# Patient Record
Sex: Male | Born: 1935 | Race: White | Hispanic: No | Marital: Married | Smoking: Never smoker
Health system: Southern US, Community
[De-identification: ages and names within clinical notes are randomized; demographics above are authoritative.]

## PROBLEM LIST (undated history)

## (undated) DIAGNOSIS — I10 Essential (primary) hypertension: Principal | ICD-10-CM

## (undated) DIAGNOSIS — I2510 Atherosclerotic heart disease of native coronary artery without angina pectoris: Secondary | ICD-10-CM

## (undated) DIAGNOSIS — F0280 Dementia in other diseases classified elsewhere without behavioral disturbance: Secondary | ICD-10-CM

## (undated) DIAGNOSIS — I482 Chronic atrial fibrillation, unspecified: Secondary | ICD-10-CM

## (undated) DIAGNOSIS — I219 Acute myocardial infarction, unspecified: Secondary | ICD-10-CM

## (undated) DIAGNOSIS — M791 Myalgia, unspecified site: Secondary | ICD-10-CM

## (undated) DIAGNOSIS — E785 Hyperlipidemia, unspecified: Secondary | ICD-10-CM

## (undated) DIAGNOSIS — I4891 Unspecified atrial fibrillation: Secondary | ICD-10-CM

## (undated) DIAGNOSIS — I480 Paroxysmal atrial fibrillation: Secondary | ICD-10-CM

## (undated) DIAGNOSIS — M549 Dorsalgia, unspecified: Secondary | ICD-10-CM

## (undated) DIAGNOSIS — E119 Type 2 diabetes mellitus without complications: Secondary | ICD-10-CM

## (undated) HISTORY — DX: Unspecified atrial fibrillation: I48.91

## (undated) HISTORY — DX: Myalgia, unspecified site: M79.10

## (undated) HISTORY — DX: Acute myocardial infarction, unspecified: I21.9

## (undated) HISTORY — DX: Essential (primary) hypertension: I10

## (undated) HISTORY — DX: Dorsalgia, unspecified: M54.9

## (undated) HISTORY — DX: Paroxysmal atrial fibrillation: I48.0

## (undated) HISTORY — DX: Atherosclerotic heart disease of native coronary artery without angina pectoris: I25.10

---

## 2005-03-24 ENCOUNTER — Ambulatory Visit: Payer: Self-pay | Admitting: Family Medicine

## 2005-07-13 ENCOUNTER — Ambulatory Visit: Payer: Self-pay | Admitting: Family Medicine

## 2005-08-01 ENCOUNTER — Ambulatory Visit: Payer: Self-pay | Admitting: Family Medicine

## 2005-09-26 ENCOUNTER — Ambulatory Visit: Payer: Self-pay | Admitting: Family Medicine

## 2005-11-21 ENCOUNTER — Ambulatory Visit: Payer: Self-pay | Admitting: Family Medicine

## 2006-01-09 ENCOUNTER — Ambulatory Visit: Payer: Self-pay | Admitting: Family Medicine

## 2006-04-18 ENCOUNTER — Ambulatory Visit: Payer: Self-pay | Admitting: Family Medicine

## 2006-04-29 ENCOUNTER — Inpatient Hospital Stay (HOSPITAL_COMMUNITY): Admission: EM | Admit: 2006-04-29 | Discharge: 2006-05-02 | Payer: Self-pay | Admitting: Emergency Medicine

## 2006-05-03 HISTORY — PX: PERCUTANEOUS CORONARY ROTOBLATOR INTERVENTION (PCI-R): SHX6015

## 2006-05-03 HISTORY — PX: CARDIAC CATHETERIZATION: SHX172

## 2006-06-12 ENCOUNTER — Ambulatory Visit (HOSPITAL_COMMUNITY): Admission: RE | Admit: 2006-06-12 | Discharge: 2006-06-13 | Payer: Self-pay | Admitting: Cardiology

## 2009-05-10 ENCOUNTER — Emergency Department (HOSPITAL_COMMUNITY): Admission: EM | Admit: 2009-05-10 | Discharge: 2009-05-10 | Payer: Self-pay | Admitting: Emergency Medicine

## 2009-06-06 ENCOUNTER — Inpatient Hospital Stay (HOSPITAL_COMMUNITY): Admission: EM | Admit: 2009-06-06 | Discharge: 2009-06-11 | Payer: Self-pay | Admitting: Emergency Medicine

## 2009-06-06 ENCOUNTER — Ambulatory Visit: Payer: Self-pay | Admitting: Family Medicine

## 2009-06-08 ENCOUNTER — Ambulatory Visit: Payer: Self-pay | Admitting: Internal Medicine

## 2009-06-10 ENCOUNTER — Encounter (INDEPENDENT_AMBULATORY_CARE_PROVIDER_SITE_OTHER): Payer: Self-pay | Admitting: General Surgery

## 2010-01-16 ENCOUNTER — Observation Stay (HOSPITAL_COMMUNITY): Admission: EM | Admit: 2010-01-16 | Discharge: 2010-01-17 | Payer: Self-pay | Admitting: Emergency Medicine

## 2011-01-29 LAB — TROPONIN I
Troponin I: 0.02 ng/mL (ref 0.00–0.06)
Troponin I: 0.03 ng/mL (ref 0.00–0.06)

## 2011-01-29 LAB — COMPREHENSIVE METABOLIC PANEL
BUN: 16 mg/dL (ref 6–23)
CO2: 27 mEq/L (ref 19–32)
Chloride: 105 mEq/L (ref 96–112)
Creatinine, Ser: 1.22 mg/dL (ref 0.4–1.5)
GFR calc Af Amer: >60 mL/min (ref >60)
Glucose, Bld: 236 mg/dL — ABNORMAL HIGH (ref 70–99)
Sodium: 138 mEq/L (ref 135–145)

## 2011-01-29 LAB — CBC
MCHC: 34 g/dL (ref 30.0–36.0)
MCV: 92.2 fL (ref 78.0–100.0)
Platelets: 143 10*3/uL — ABNORMAL LOW (ref 150–400)
RBC: 4.04 MIL/uL — ABNORMAL LOW (ref 4.22–5.81)
RDW: 13.4 % (ref 11.5–15.5)
WBC: 11.4 10*3/uL — ABNORMAL HIGH (ref 4.0–10.5)

## 2011-01-29 LAB — BRAIN NATRIURETIC PEPTIDE: Pro B Natriuretic peptide (BNP): 186 pg/mL — ABNORMAL HIGH (ref 0.0–100.0)

## 2011-01-29 LAB — LIPID PANEL
Cholesterol: 108 mg/dL (ref 0–200)
HDL: 53 mg/dL (ref >39)
LDL Cholesterol: 41 mg/dL (ref 0–99)
Total CHOL/HDL Ratio: 2 RATIO
Triglycerides: 70 mg/dL (ref <150)

## 2011-01-29 LAB — DIFFERENTIAL
Basophils Absolute: 0 10*3/uL (ref 0.0–0.1)
Basophils Relative: 0 % (ref 0–1)
Eosinophils Absolute: 0 10*3/uL (ref 0.0–0.7)
Lymphs Abs: 0.7 10*3/uL (ref 0.7–4.0)
Monocytes Absolute: 0.6 10*3/uL (ref 0.1–1.0)
Monocytes Relative: 6 % (ref 3–12)
Neutrophils Relative %: 88 % — ABNORMAL HIGH (ref 43–77)

## 2011-01-29 LAB — GLUCOSE, CAPILLARY: Glucose-Capillary: 110 mg/dL — ABNORMAL HIGH (ref 70–99)

## 2011-01-29 LAB — CK TOTAL AND CKMB (NOT AT ARMC)
CK, MB: 1.9 ng/mL (ref 0.3–4.0)
Relative Index: INVALID (ref 0.0–2.5)
Relative Index: INVALID (ref 0.0–2.5)

## 2011-01-29 LAB — HEPARIN LEVEL (UNFRACTIONATED): Heparin Unfractionated: 0.15 IU/mL — ABNORMAL LOW (ref 0.30–0.70)

## 2011-01-29 LAB — APTT: aPTT: 81 seconds — ABNORMAL HIGH (ref 24–37)

## 2011-02-11 LAB — GLUCOSE, CAPILLARY
Glucose-Capillary: 129 mg/dL — ABNORMAL HIGH (ref 70–99)
Glucose-Capillary: 131 mg/dL — ABNORMAL HIGH (ref 70–99)
Glucose-Capillary: 131 mg/dL — ABNORMAL HIGH (ref 70–99)
Glucose-Capillary: 137 mg/dL — ABNORMAL HIGH (ref 70–99)
Glucose-Capillary: 139 mg/dL — ABNORMAL HIGH (ref 70–99)
Glucose-Capillary: 141 mg/dL — ABNORMAL HIGH (ref 70–99)
Glucose-Capillary: 147 mg/dL — ABNORMAL HIGH (ref 70–99)
Glucose-Capillary: 152 mg/dL — ABNORMAL HIGH (ref 70–99)
Glucose-Capillary: 155 mg/dL — ABNORMAL HIGH (ref 70–99)
Glucose-Capillary: 188 mg/dL — ABNORMAL HIGH (ref 70–99)

## 2011-02-11 LAB — COMPREHENSIVE METABOLIC PANEL
ALT: 26 U/L (ref 0–53)
AST: 25 U/L (ref 0–37)
AST: 34 U/L (ref 0–37)
Albumin: 2.5 g/dL — ABNORMAL LOW (ref 3.5–5.2)
Albumin: 2.7 g/dL — ABNORMAL LOW (ref 3.5–5.2)
Alkaline Phosphatase: 55 U/L (ref 39–117)
Alkaline Phosphatase: 56 U/L (ref 39–117)
Alkaline Phosphatase: 57 U/L (ref 39–117)
BUN: 16 mg/dL (ref 6–23)
BUN: 19 mg/dL (ref 6–23)
BUN: 7 mg/dL (ref 6–23)
CO2: 30 mEq/L (ref 19–32)
Calcium: 8.2 mg/dL — ABNORMAL LOW (ref 8.4–10.5)
Calcium: 8.3 mg/dL — ABNORMAL LOW (ref 8.4–10.5)
Calcium: 8.6 mg/dL (ref 8.4–10.5)
Creatinine, Ser: 0.96 mg/dL (ref 0.4–1.5)
GFR calc Af Amer: >60 mL/min (ref >60)
GFR calc Af Amer: >60 mL/min (ref >60)
GFR calc non Af Amer: >60 mL/min (ref >60)
Glucose, Bld: 159 mg/dL — ABNORMAL HIGH (ref 70–99)
Glucose, Bld: 206 mg/dL — ABNORMAL HIGH (ref 70–99)
Glucose, Bld: 209 mg/dL — ABNORMAL HIGH (ref 70–99)
Potassium: 2.9 mEq/L — ABNORMAL LOW (ref 3.5–5.1)
Potassium: 3.2 mEq/L — ABNORMAL LOW (ref 3.5–5.1)
Potassium: 3.3 mEq/L — ABNORMAL LOW (ref 3.5–5.1)
Sodium: 134 mEq/L — ABNORMAL LOW (ref 135–145)
Total Bilirubin: 3 mg/dL — ABNORMAL HIGH (ref 0.3–1.2)
Total Protein: 5.5 g/dL — ABNORMAL LOW (ref 6.0–8.3)
Total Protein: 5.7 g/dL — ABNORMAL LOW (ref 6.0–8.3)
Total Protein: 6 g/dL (ref 6.0–8.3)
Total Protein: 6.8 g/dL (ref 6.0–8.3)

## 2011-02-11 LAB — CBC
HCT: 33.9 % — ABNORMAL LOW (ref 39.0–52.0)
HCT: 34.2 % — ABNORMAL LOW (ref 39.0–52.0)
HCT: 37.4 % — ABNORMAL LOW (ref 39.0–52.0)
HCT: 42.8 % (ref 39.0–52.0)
Hemoglobin: 12.5 g/dL — ABNORMAL LOW (ref 13.0–17.0)
Hemoglobin: 13 g/dL (ref 13.0–17.0)
Hemoglobin: 15 g/dL (ref 13.0–17.0)
MCHC: 34.7 g/dL (ref 30.0–36.0)
MCHC: 34.9 g/dL (ref 30.0–36.0)
MCV: 89.1 fL (ref 78.0–100.0)
MCV: 90.4 fL (ref 78.0–100.0)
Platelets: 105 10*3/uL — ABNORMAL LOW (ref 150–400)
Platelets: 123 10*3/uL — ABNORMAL LOW (ref 150–400)
RDW: 13 % (ref 11.5–15.5)
RDW: 13.2 % (ref 11.5–15.5)
RDW: 13.3 % (ref 11.5–15.5)
RDW: 13.3 % (ref 11.5–15.5)
RDW: 13.3 % (ref 11.5–15.5)
WBC: 6.6 10*3/uL (ref 4.0–10.5)
WBC: 9.2 10*3/uL (ref 4.0–10.5)

## 2011-02-11 LAB — HEMOGLOBIN A1C
Hgb A1c MFr Bld: 7.3 % — ABNORMAL HIGH (ref 4.6–6.1)
Mean Plasma Glucose: 160 mg/dL
Mean Plasma Glucose: 163 mg/dL

## 2011-02-11 LAB — D-DIMER, QUANTITATIVE: D-Dimer, Quant: 0.35 ug/mL-FEU (ref 0.00–0.48)

## 2011-02-11 LAB — POCT CARDIAC MARKERS
CKMB, poc: 2.4 ng/mL (ref 1.0–8.0)
Myoglobin, poc: 149 ng/mL (ref 12–200)
Troponin i, poc: <0.05 ng/mL (ref 0.00–0.09)

## 2011-02-11 LAB — BASIC METABOLIC PANEL
BUN: 5 mg/dL — ABNORMAL LOW (ref 6–23)
Creatinine, Ser: 1.02 mg/dL (ref 0.4–1.5)
GFR calc non Af Amer: >60 mL/min (ref >60)
Glucose, Bld: 146 mg/dL — ABNORMAL HIGH (ref 70–99)
Potassium: 3.4 mEq/L — ABNORMAL LOW (ref 3.5–5.1)

## 2011-02-11 LAB — DIFFERENTIAL
Basophils Absolute: 0 10*3/uL (ref 0.0–0.1)
Basophils Relative: 0 % (ref 0–1)
Monocytes Relative: 3 % (ref 3–12)
Neutro Abs: 10.4 10*3/uL — ABNORMAL HIGH (ref 1.7–7.7)
Neutrophils Relative %: 91 % — ABNORMAL HIGH (ref 43–77)

## 2011-02-11 LAB — HEPATIC FUNCTION PANEL
Albumin: 3.9 g/dL (ref 3.5–5.2)
Total Protein: 7 g/dL (ref 6.0–8.3)

## 2011-02-11 LAB — LIPASE, BLOOD: Lipase: 17 U/L (ref 11–59)

## 2011-02-12 LAB — POCT CARDIAC MARKERS
CKMB, poc: 1.5 ng/mL (ref 1.0–8.0)
CKMB, poc: 2 ng/mL (ref 1.0–8.0)
Troponin i, poc: <0.05 ng/mL (ref 0.00–0.09)
Troponin i, poc: <0.05 ng/mL (ref 0.00–0.09)

## 2011-02-12 LAB — POCT I-STAT, CHEM 8
BUN: 14 mg/dL (ref 6–23)
Calcium, Ion: 1.14 mmol/L (ref 1.12–1.32)
Chloride: 96 mEq/L (ref 96–112)
Glucose, Bld: 204 mg/dL — ABNORMAL HIGH (ref 70–99)
HCT: 40 % (ref 39.0–52.0)
Potassium: 3 mEq/L — ABNORMAL LOW (ref 3.5–5.1)

## 2011-03-21 NOTE — H&P (Signed)
NAMEPAYNE, GARSKE               ACCOUNT NO.:  [id number]   MEDICAL RECORD NO.:  [id number]          PATIENT TYPE:  INP   LOCATION:  5504                         FACILITY:  [facility]   PHYSICIAN:  Wayne A. Sheffield Slider, M.D.    DATE OF BIRTH:  1925-06-11   DATE OF ADMISSION:  06/06/2009  DATE OF DISCHARGE:                              HISTORY & PHYSICAL   HISTORY OF PRESENT ILLNESS:  Mr. Hamre presents to the [facility] ED  after experiencing epigastric/substernal pain starting yesterday after a  large greasy lunch.  His pain is worse as 7/10 and is nonradiating.  He  occasionally has experienced similar pain but was usually able to  relieve it by burping.  However, his current pain is relieved by  nothing.  His past medical history is significant for coronary artery  disease with stent in 2007.  Therefore, he presented to the ED for this  pain.  He is nauseated but has not vomited.  He denies lightheadedness,  fevers, chills, weight loss, vomiting blood or blood per rectum or  melena and he feels well, otherwise.  He did take nitroglycerin for this  pain which did not help and cause dizziness.  His PCP is Dr. Lysbeth Galas,  [facility], [facility].   PAST MEDICAL HISTORY:  Significant for:  1. Coronary artery disease with cardiac cath and stent in 2007.  Note      a normal recent stress test.  2. Hypertension.  3. AFib.  4. GERD.   PAST SURGICAL HISTORY:  He has a remote history of a back surgery with  no complications.   MEDICATIONS:  1. Plavix 75 mg daily.  2. Diovan 160 mg daily.  3. Metoprolol tartrate 50 mg b.i.d.  4. Aspirin 325 mg once a day.  5. Amlodipine 5 mg daily.  6. Vytorin 10/40 mg daily.  7. Pantoprazole 40 mg daily.  8. Coenzyme Q10 daily.   ALLERGIES:  No known drug allergies.   FAMILY HISTORY:  Noncontributory.   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  He remains physically  active.  He does not smoke.  Does not drink and denies any other drug use.   REVIEW OF SYSTEMS:  A 12-item review of systems is negative with the  exception of what noted in the HPI.  He feels like he can walk up at  least 2 flight of stairs and continue going without becoming out of  breath.   PHYSICAL EXAMINATION:  VITAL SIGNS:  Temperature 97.5, heart rate 62,  blood pressure 120/61, respiratory rate 17, sating 100% on room air.  GENERAL:  A well-appearing fit male in his 61s, in no acute distress.  HEENT:  His eyes have a disconjugate gaze.  He has no conjunctival  injection and he has no scleral icterus noted.  CHEST:  His lungs are clear to auscultation bilaterally.  HEART:  Normal rate and a normal rhythm to my exam.  He has no murmurs,  rubs or gallops and this was auscultated over  at least 20 seconds.  ABDOMEN:  He has a thin appearing abdomen with positive bowel sounds  that are normal.  No masses were palpated.  He is nontender.  He does  not have a Murphy sign.  EXTREMITIES:  His feet are cool to the touch and he has 2+ dorso pedal  pulses bilaterally and is nonedematous.  SKIN:  He is slightly jaundiced in appearance.   LABORATORY DATA/STUDIES:  CBC:  White count of 11.4, hemoglobin 15,  hematocrit 42.8, platelets 152.  His neutrophil count was 91%.  Complete  metabolic panel:  Sodium 139, potassium 2.9, chloride 102, bicarb 28,  BUN 19, creatinine 1.29, glucose 209.  Liver panel:  Total bilirubin  2.8, direct bilirubin 0.4, indirect bilirubin 2.4, alk phos 57, AST 33,  ALT 18, total protein 6.8, albumin 3.9, calcium 9.4, lipase 17.  Cardiac enzymes within normal limits x2.  D-dimer within normal limits.  EKG showing AFib with a rate of 85.  No ST elevation and nonspecific T-  wave changes.  Ultrasound of abdomen showing a 1.5-cm stone in the neck of gallbladder  with a wall thickness of upper limits of normal and a common bile duct  diameter of  7 mm.  Also of note was  a right kidney with a 7 cm complex  cyst noted.   ASSESSMENT AND PLAN:  An 77 year old male with cholecystitis.  1. Cholecystitis.  General Surgery is consulted.  Surgeon is Dr.      Luisa Hart.  He is aware and requests ERCP and n.p.o. status.  GI consult Dr. Elnoria Howard at [facility] is aware and we will see the patient and  perform ERCP as needed.  Plan, n.p.o., IV hydration, and IV pain medication for pain control.  We  would like an ERCP to evaluate for the possibility of  choledocholithiasis versus cholecystitis.  We will also start  ciprofloxacin IV for prophylaxis against ascending cholangitis and  follow recommendations that General Surgery and Gastroenterology leave.  1. Atrial fibrillation is well rate controlled at home meds.  We will      continue home meds as tolerated.  Of note his CHADS2 score is 2      versus 3 depending on diagnosis of diabetes.  We would recommend      converting to warfarin therapy as an outpatient, however, at the      moment we will continue his current therapy.  2. Diabetes.  He has an elevated random glucose.  He has no history of      diabetes.  We will provide sliding scale insulin and follow.  A      fingerstick glucose with meals while the patient is in-house and we      will attempt to make a diagnosis of diabetes.  HbA1c to follow.  3. History of coronary artery disease is stable and has a negative      workup and a recent negative stress test.  We will continue aspirin      and Plavix unless directed otherwise by GI Surgery or      Gastroenterology.  4. Complex cysts noted by ultrasound.  Ultrasound tech recommends      check radiology, recommends renal MRI to further evaluate.  We will      defer this for outpatient management.  5. Prophylaxis.  We will provide pantoprazole for ulcer prophylaxis      and heparin t.i.d. for DVT prophylaxis.  We will hold heparin until  after surgery.  In the meantime, we will provide SCDs.  We also       encouraged the patient to ambulate.      Clementeen Graham, MD  Electronically Signed      Arnette Norris. Sheffield Slider, M.D.  Electronically Signed    EC/MEDQ  D:  06/06/2009  T:  06/07/2009  Job:  [id number]

## 2011-03-21 NOTE — Op Note (Signed)
Patrick Potts               ACCOUNT NO.:  [id number]   MEDICAL RECORD NO.:  [id number]           PATIENT TYPE:   LOCATION:                                 FACILITY:   PHYSICIAN:  Lennie Muckle, MD      DATE OF BIRTH:  [birth year]   DATE OF PROCEDURE:  06/10/2009  DATE OF DISCHARGE:                               OPERATIVE REPORT   PREOPERATIVE DIAGNOSES:  Cholecystitis and cholelithiasis.   POSTOPERATIVE DIAGNOSES:  Cholecystitis and cholelithiasis.   PROCEDURE:  Laparoscopic cholecystectomy with cholangiogram.   SURGEON:  Amber L. Freida Busman, MD   ASSISTANT:  Letha Cape, PA   General endotracheal anesthesia.   FINDINGS:  Large stone in the neck of the gallbladder, patent common  bile duct, right and left hepatic ducts.   SPECIMENS:  Gallbladder to pathology.   BLOOD LOSS:  50 mL.   No immediate complications.   No drains were placed.   INDICATIONS FOR PROCEDURE:  [name] is an 77 year old male who was  admitted on June 06, 2009, with abdominal pain.Patrick Potts  He was found to have  a stone within the gallbladder.  He had an elevation of his bilirubin to  2.8.  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  He had a HIDA scan which did reveal flow  within the common duct with obstruction of the cystic duct.  He had been  on Plavix, therefore he was placed on saline for 5 days due to possible  bleeding issues.  Informed consent was obtained after explaining the  risks of bleeding, infection, open surgery, injury to common bile duct,  etc., with the patient and his family.  All questions were answered.   DETAILS OF PROCEDURE:  Mr. [name] was identified in preoperative holding  area.  He received Cipro, and he had been on this preoperatively.  Once  in the operating room, placed in supine position.  After administration  of general tracheal anesthesia, SCDs were applied to his lower  extremity.  His abdomen was prepped and  draped in usual sterile fashion.  A time-out procedure indicating patient and procedure were performed.  I  placed an incision beneath the umbilicus, grasped the fascia with the  Kocher.  I placed a Veress needle into the abdominal cavity.  Saline was  aspirated.  Air bubbles were aspirated only, and the fluid went easily  into the abdomen.  After obtaining adequate pneumoperitoneum, I placed  an 11-mm trocar in the abdominal cavity using the OptiVu.  All layers of  abdominal wall were visualized upon entry.  I then inspected the abdomen  and found no evidence of injury upon placement.  The patient was then  placed in the reverse Trendelenburg right side up position.  The  gallbladder was noted to be distended.  Omental adhesions were on the  gallbladder.  We decompressed the gallbladder using the suction  irrigator device.  I grasped the fundus and retracted this.  The head of  the patient's gallbladder wall was thickened making some retraction  difficult.  A large stone was in the neck of the gallbladder.  I was  able to dissect using mostly blunt dissection and some electrocautery to  obtain a view of the cystic duct and the cystic artery which was  immediately posteromedial.  The wall was rather thickened.  I placed a  clip distally on the cystic duct and partially transected this  laparoscopic scissors.  I was able to see the cholangiogram catheter in  the cystic duct without difficulty.  Cholangiogram revealed flow easily  into the duodenum, right and left hepatic ducts.  The duct was then  clipped distally.  I also clipped and divided the entrance of the cystic  duct.  The cystic artery was clipped and divided as well.  I then  continued dissecting using both the suction irrigator as well as the  electrocautery for the remaining peritoneal attachments of gallbladder  to the liver.  Specimen was placed in an Endo Catch bag and removed from  the umbilical incision.  I then inspected  the liver bed.  There was only  a minimal amount of oozing.  This was easily controlled with  electrocautery.  Due to his history of being on Plavix and the need for  this postoperatively, I did place Surgicel within the gallbladder fossa.  Final inspection of the abdomen revealed no bleeding and no injury.  I  closed the fascial defect at the umbilical region using a 0 Vicryl  suture in a figure-of-eight fashion.  I then released pneumoperitoneum,  removed the trocars, and closed the skin with 4-0 Monocryl.  Dermabond  was placed following the dressing.  The patient was then extubated and  transferred to postanesthesia care unit in stable condition.  He will be  monitored overnight on telemetry bed and then likely discharge home  tomorrow.      Lennie Muckle, MD  Electronically Signed     ALA/MEDQ  D:  06/10/2009  T:  06/10/2009  Job:  [id number]   cc:   Everardo Beals. Juanda Chance, MD, [facility]  Wayne A. Sheffield Slider, M.D.

## 2011-03-21 NOTE — Discharge Summary (Signed)
Patrick Potts, Patrick Potts               ACCOUNT NO.:  [id number]   MEDICAL RECORD NO.:  [id number]          PATIENT TYPE:  INP   LOCATION:  5506                         FACILITY:  [facility]   PHYSICIAN:  Patrick Potts, M.D.    DATE OF BIRTH:  1925-09-11   DATE OF ADMISSION:  06/06/2009  DATE OF DISCHARGE:  06/11/2009                               DISCHARGE SUMMARY   DISCHARGE DIAGNOSES:  1. Acute cholecystitis.  2. Atrial fibrillation.  3. Coronary artery disease.  4. Diabetes.  5. Hypertension.   PRIMARY CARE Patrick Potts:  Patrick Meigs, MD, [facility].   DISCHARGE MEDICATIONS:  1. Percocet 5 mg/325 mg.  2. Plavix 75 mg.  3. Metoprolol 50 mg 2 times a day.  4. Diovan 160 mg once a day.  5. Aspirin 325 once a day.  6. Norvasc 5 mg.  7. Vytorin 10/40 daily.  8. Protonix 40 a day.  9. Coenzyme Q10 once a day.   CONSULTANTS:  1. Patrick Hawks Elnoria Howard, MD, Gastroenterology, [place].  2. Patrick Pu. Cornett, MD, [facility].   PROCEDURES:  Laparoscopic cholecystectomy and cholangiogram performed by  surgeon Dr. Freida Potts on June 10, 2009, please see operative report for  further details.   LABORATORY DATA:  Admission lab showed CBC within normal limits with the  exception of elevated white blood cell count 11.4 with 94% neutrophils.  Comprehensive metabolic panel was significant for potassium of 2.9,  glucose of 209, total bilirubin of 3.  Of note, AST and ALT were within  normal limits, as were lipase, D-dimer, and cardiac markers.  The total  of the bilirubin breakdown was an indirect bilirubin 2.4.  His bilirubin  peaked at 3.8 on August 4, and decreased following procedure.  As we  noticed his elevated blood glucose, his hemoglobin A1c was 7.2.   Discharge labs were relatively within normal limits with the exception  of potassium of 3.4, glucose of 146, and CBC had normalized.   STUDIES:  He had a HIDA scan, which showed a patent common bile duct and  obstructed cystic duct with no gallbladder function.  He also had  abdominal ultrasound showing a 1.5 cm stone impacted in the neck of the  gallbladder, and the common bile duct was 7 mm in diameter.  Chest x-ray  was within normal limits.  Intraoperative cholangiogram showed  essentially patent CBD with spasm of the sphincter of Oddi.   BRIEF HOSPITAL COURSE:  This is an 77 year old male admitted with  classic signs of acute cholelithiasis, cholecystitis, and he was  admitted in the [facility] Teaching Service.  As Patrick Potts had  coronary artery disease, and have been taking Plavix and aspirin, the  surgeons had to wait for 5 days for his platelet function to return to  normal with no Plavix or aspirin.  In this time, he was evaluated for  the possibility of choledocholithiasis with a GI consult and eventually  a HIDA scan showing a patent common bile duct.  Following normalization  of his platelets and proof of no stones in the common bile duct,  he was  taken to the OR on August 5.  The procedure was very well without  complications and he was discharged to home on August 6, the postop day  #1.  While he was in the hospital Patrick Potts was found to have an  elevated blood glucose, likely secondary to stress reaction from his  disease as well as of IV D5 while he was n.p.o.  However, his A1c is  slightly elevated at 7.2; therefore, we recommend that he will be  evaluated and treated for diabetes by his primary care Patrick Potts.  Also  in the hospital, Patrick Potts has a history of AFib, which was well rate  recontrolled; however, we decided to anti-coagulate him with  subcutaneous Lovenox while he was here in the hospital, and we recommend  he be treated for his AFib with oral vitamin K antagonist as an  outpatient.  He has had this conversation with his cardiologist before;  however, now his CHADS2 score is 3 with his age, his coronary artery  disease, and his diabetes.  We strongly  recommend treatment and therapy.   ISSUES TO BE FOLLOWED UP AFTER DISCHARGE:  Please see above for AFib and  diabetes.   FOLLOWUP APPOINTMENT:  He has an appointment to see his primary care  Patrick Potts Patrick Potts on Monday August 9 at 1:15 and he has an appointment  to see Patrick Potts on August 24 at 2:00 p.m.  We also  instructed him to schedule an appointment with Patrick Potts, his  cardiologist to [facility] within this month to evaluate and  possibly treat his AFib.   DISCHARGE CONDITION:  The patient was discharged in stable medical  condition.      Patrick Graham, MD  Electronically Signed      Patrick Potts, M.D.  Electronically Signed    EC/MEDQ  D:  06/11/2009  T:  06/12/2009  Job:  [id number]   cc:   Patrick Potts, M.D.  Patrick [facility], M.D.  [facility]

## 2011-03-21 NOTE — Consult Note (Signed)
Patrick Potts, Patrick Potts               ACCOUNT NO.:  [id number]   MEDICAL RECORD NO.:  [id number]          PATIENT TYPE:  INP   LOCATION:  5504                         FACILITY:  [facility]   PHYSICIAN:  Maisie Fus A. Cornett, M.D.DATE OF BIRTH:  Dec 20, 1924   DATE OF CONSULTATION:  06/06/2009  DATE OF DISCHARGE:                                 CONSULTATION   PHYSICIAN REQUESTING CONSULTATION:  Dione Booze, MD   REASON FOR CONSULTATION:  Chest pain and gallstones.   HISTORY OF PRESENT ILLNESS:  The patient is an 77 year old male who  presents with a multi-hour history of chest pain.  He had similar pain  back in July, was seen and then sent home after workup revealed  pneumonia.  In any event, the pain recurs.  The pain is sharp in nature,  located in his epigastrium and chest associated with nausea, no  vomiting.  He has no arm pain radiating down either arm.  There is no  pain radiating to his back.  The pain now is actually better than when  he arrived this morning.  He also has had some burping with this as well  and has been treated for peptic ulcer disease and reflux.  I was asked  to see with request of Dr. Preston Fleeting because workup revealed gallstones  without cholecystitis and an elevated bilirubin.  He has a past history  of coronary artery disease with history of chronic AFib stents and he is  on Plavix currently.   PAST MEDICAL HISTORY:  1. Coronary artery disease.  2. Chronic AFib.  3. History of previous angioplasty with stents.  4. Plavix.  5. Hypertension.  6. Hyperlipidemia.   PAST SURGICAL HISTORY:  Cardiac stenting after angioplasty.   SOCIAL HISTORY:  Negative tobacco use.  Negative alcohol use.  He is  married.   ALLERGIES:  None.   MEDICATIONS:  1. Plavix 75 mg daily,  2. Diovan 160 mg daily.  3. Metoprolol 50 mg daily.  4. Aspirin 325 daily.  5. Amlodipine 5 mg daily.  6. Vytorin 10/40 daily.  7. Protonix 40 mg daily.   FAMILY HISTORY:  Noncontributory.   REVIEW OF SYSTEMS:  As stated above, otherwise 15-point review of  systems negative.   PHYSICAL EXAMINATION:  VITAL SIGNS:  Temperature 97, blood pressure  120/61, pulse 62, respiratory rate 17.  HEENT:  Positive for scleral icterus.  He does have one eye that drifts.  He wears glasses.  NECK:  Supple.  Nontender.  No JVD.  No mass.  CHEST:  Clear to auscultation.  Chest wall motion and excursion are  normal.  CARDIOVASCULAR:  Irregular rate and rhythm without rub, murmur, or  gallop.  EXTREMITIES:  Well-perfused.  ABDOMEN:  Soft.  There is minimal right upper quadrant tenderness and  negative Murphy sign.  No palpable mass.  No peritonitis or hernia.  EXTREMITIES:  No edema noted.  Muscle tone and range of motion are  normal for a man of his age.  NEUROLOGIC:  Glasgow coma scale is 15.  Motor and sensory function are  grossly intact.   LABORATORY  STUDIES:  His white count 11,400, hemoglobin 15, and platelet  count is 150,000 with a left shift.  Sodium 139, potassium 2.9, chloride  102, CO2 of 28, BUN 19, creatinine 1.3, and glucose 209.  Total  bilirubin is 2.8, direct 0.4, and indirect 2.4.  Alk phos is 57.  Lipase  is normal.  Ultrasound, which I have reviewed shows a relatively normal-  appearing Gallbladder with a stone in the neck of the gallbladder  without any significant distention or gallbladder wall thickening.  There is no pericholecystic fluid.  Common duct measures 7 mm.   IMPRESSION:  1. Cholelithiasis, symptomatic with questionable cholecystitis.  2. Hyperbilirubinemia.  3. Coronary artery disease with history of chronic atrial      fibrillation.  4. Hypertension.  5. Hyperlipidemia.   PLAN:  At this point in time, I see no emergent need for  cholecystectomy.  I recommended GI consultation to evaluate for  potential ERCP.  Also, his heart issues and medical issues need to be  addressed prior to any surgical intervention.  He is on Plavix.  I  recommend holding  this for now for future procedure work, which will  need to be done.  Of note, he had a stress test 6 months ago.  He said,  he did fine with that.      Thomas A. Cornett, M.D.  Electronically Signed     TAC/MEDQ  D:  06/06/2009  T:  06/07/2009  Job:  [id number]   cc:   Francisca December, M.D.

## 2011-03-21 NOTE — Consult Note (Signed)
Patrick Potts, Patrick Potts               ACCOUNT NO.:  [id number]   MEDICAL RECORD NO.:  [id number]          PATIENT TYPE:  INP   LOCATION:  5504                         FACILITY:  [facility]   PHYSICIAN:  Jordan Hawks. Elnoria Howard, MD    DATE OF BIRTH:  Sep 15, 1925   DATE OF CONSULTATION:  06/06/2009  DATE OF DISCHARGE:                                 CONSULTATION   REASON FOR CONSULTATION:  Cholelithiasis and questionable  choledocholithiasis.   HISTORY OF PRESENT ILLNESS:  This is 77 year old gentleman with a past  medical history of hypertension; coronary artery disease, status post  stent approximately 3 years ago; paroxysmal atrial fibrillation;  gastroesophageal reflux disease who presents to the emergency room with  complaints of indigestion that his persistent.  The patient states that  after eating a meal yesterday, he started to have brief indigestion-type  complaints, the pain was quite significant and did not abate and because  of the persistence of the pain, he presented to the emergency room for  further evaluation and treatment.  A right upper quadrant ultrasound was  performed, there was evidence of a 1.5-cm stone in the neck of the  gallbladder, but no biliary ductal dilation.  His transaminases were  within normal range, but his total bilirubin was elevated at 2.8.  He  was previously in the emergency room approximately 3 weeks ago with  similar type of complaints, but no liver panel imaging or examination  was performed at that time.  He was discharged home with Protonix and  since that time, he has not had any symptoms of this type of pain until  this episode.  Because of the findings of the stones and his epigastric  discomfort, GI consultation was requested.   PAST MEDICAL HISTORY AND PAST SURGICAL HISTORY:  As stated above in  history of present illness.   FAMILY HISTORY:  Noncontributory.   SOCIAL HISTORY:  Negative for alcohol, tobacco, or illicit drug use.   REVIEW OF  SYSTEMS:  As stated above in history of present illness,  otherwise negative.   MEDICATIONS:  1. Plavix 75 mg one p.o. daily.  2. Aspirin 325 mg p.o. daily.  3. Diovan 160 mg p.o. daily.  4. Metoprolol 50 mg p.o. b.i.d.  5. Amlodipine 5 mg p.o. daily.  6. Vytorin 10/40 one p.o. daily.  7. Protonix 40 mg p.o. daily.   PHYSICAL EXAMINATION:  VITAL SIGNS:  Stable.  The temperature is 97.5.  GENERAL:  The patient is in no acute distress, alert and oriented.  HEENT:  Normocephalic and atraumatic.  Extraocular muscles intact.  There is slight scleral icterus.  NECK:  Supple.  No lymphadenopathy.  LUNGS:  Clear to auscultation bilaterally.  CARDIOVASCULAR:  Regular rate and rhythm.  ABDOMEN:  Flat, soft, nontender, and nondistended.  Positive bowel  sounds.  EXTREMITIES:  No clubbing, cyanosis, or edema.   LABORATORY VALUES:  White blood cell count is at 11.4, hemoglobin 15.0,  MCV is 89.3, platelets at 152.  Sodium 139, potassium 2.9, chloride 102,  CO2 of 28, glucose 209, BUN 19, creatinine 1.2.  Total bilirubin  is 2.8,  alk phos is 56, AST 32, ALT 16, albumin is 3.9.   IMPRESSION:  1. Cholelithiasis versus cholecystitis.  2. Hyperbilirubinemia.  3. Hypertension.  4. Coronary artery disease.   After evaluation, I do not believe he requires a preoperative ERCP.  His  biliary duct is not dilated at this time and there is no elevation in  his transaminases, although the total bilirubin is elevated and it is  positive suggested that he may have passed a gallstone.  His pain is  persistent, there is a 1.5-cm stone in the neck of the gallbladder,  which could be causing a symptomatic cholelithiasis versus that of acute  cholecystitis.   Plan at this time is to repeat the liver panel and transaminases are  increased and an ERCP is warranted.  If the transaminase is decrease  and/or the total bilirubin decreases or remains normal, then the patient  should undergo a laparoscopic  cholecystectomy before the intraoperative  cholangiogram is positive, then an ERCP will be performed.  At this  time, the patient should be on ciprofloxacin 400 mg IV q.12 h.      Jordan Hawks Elnoria Howard, MD  Electronically Signed     PDH/MEDQ  D:  06/06/2009  T:  06/07/2009  Job:  [id number]

## 2011-03-24 NOTE — Discharge Summary (Signed)
Patrick Potts, Patrick Potts               ACCOUNT NO.:  [id number]   MEDICAL RECORD NO.:  [id number]          PATIENT TYPE:  INP   LOCATION:  2013                         FACILITY:  [facility]   PHYSICIAN:  Guy Franco, P.A.       DATE OF BIRTH:  [birth year]   DATE OF ADMISSION:  04/29/2006  DATE OF DISCHARGE:  05/02/2006                                 DISCHARGE SUMMARY   DISCHARGE DIAGNOSES:  1.  Coronary artery disease, status post bare metal stent to the proximal      right coronary artery on 04/30/2006.  2.  Chronic atrial fibrillation.  3.  Hypokalemia, replaced.  4.  Hyperlipidemia.  5.  Hypertension.  6.  Long-term medication use.  7.  History of back surgery.   HISTORY OF PRESENT ILLNESS:  Patrick Potts is a 77 year old male patient with  no known coronary artery disease, who complains of 2 years of intermittent  angina, which has been treated by his primary care physician.  Over the past  2 months he has noted increasing episodes, typically with ambulation and  better with rest.  At 6 a.m. on the day of admission, he awoke with  substernal chest pain.  His wife took him to a [employer] cabin crew department, who  then called EMS and the patient was transported to [facility].   The patient then underwent cardiac catheterization on April 30, 2006, and the  findings were as follows; EF 55% with no wall motion abnormality, left main  angiography revealed normal LAD and a 90% lesion at the first diagonal left  circumflex, 90% at OM1.  RCA was a 95% ostial lesion for which Dr. Amil Amen  placed a bare metal stent, reducing the lesion to a 10% lesion post  procedure.   The patient tolerated the procedure well; however, at completion of the  study he had transient hypertension and bradycardia.  He responded well to  atropine and dopamine and then developed an atrial fibrillation with rapid  ventricular rate.  By the time the patient left the room, his blood pressure  was 100/70 and his ventricular rate  was 80.   By the next day, the patient had been weaned off of his dopamine and his  blood presser and heart rate were stable.  We did place him on a low-dose  beta blocker for his cardiac disease and increased heart rate.  He was noted  to be hypokalemic and this was replaced.  On May 02, 2006, he was ready for  discharge to home.  He does have a residual disease that will need rest and  office followup.   ALLERGIES:  PLEASE NOTE, THAT THE PATIENT STATES THAT STATINS CAUSE  MYALGIAS.   DISCHARGE LABS:  Include a lipid profile, which showed total cholesterol of  203, triglycerides 73, LDL 151, HDL 37.  White count 7.2, hemoglobin 12.1,  hematocrit 35.2, platelets 161, sodium 136, potassium 3.6, BUN 15,  creatinine 1.1.   DISCHARGE MEDICATIONS:  The patient is discharged home in stable condition  on the following medications:  1.  Metoprolol 50 mg 1  p.o. b.i.d.  2.  Cardura 4 mg a day.  3.  Zetia 10 mg a day.  4.  Diovan/HCTZ 160/25 mg 1 p.o. daily.  5.  Enteric-coated aspirin 325 mg a day.  6.  Coenzyme Q-10 daily.  7.  Sublingual nitroglycerin p.r.n. chest pain.   DISCHARGE INSTRUCTIONS:  He is instructed to stop propranolol.  He is not to  drive for 2 days, not lifting over 10 pounds for 1 week.  Clean cath site  gently with soap and water, no scrubbing.  Call for any recurrent chest  pain.  He has been instructed to participate in cardiac rehabilitation.  He  participated in this care during this hospital stay.  He is to return to see  Dr. Amil Amen on May 17, 2006, at 1 p.m.      Guy Franco, P.A.     LB/MEDQ  D:  05/23/2006  T:  05/23/2006  Job:  [id number]   cc:   Delaney Meigs, M.D.  Fax: [phone]   Francisca December, M.D.  Fax: [phone]

## 2011-03-24 NOTE — H&P (Signed)
Patrick, Potts               ACCOUNT NO.:  [id number]   MEDICAL RECORD NO.:  [id number]          PATIENT TYPE:  INP   LOCATION:  2013                         FACILITY:  [facility]   PHYSICIAN:  Francisca December, M.D.  DATE OF BIRTH:  1925-02-13   DATE OF ADMISSION:  04/29/2006  DATE OF DISCHARGE:                                HISTORY & PHYSICAL   CHIEF COMPLAINT:  Chest pain.   HISTORY OF PRESENT ILLNESS:  [name] is a 77 year old male patient with  no known history of coronary artery disease who complained of two years of  intermittent angina.  Over the past two months he has had increasing  episodes, typically with ambulation, and better at rest.  Up to this point  he has not undergone a cardiac work up. On the date of admission around 6  A.M. he awoke with substernal chest pain.  He had no sublingual  nitroglycerin to take, therefore, his wife took him to a [employer] cabin crew  department.  They immediately transported him via EMS to [facility].   PAST MEDICAL HISTORY:  Chronic atrial fibrillation for approximately 10  years, has never been on Coumadin.  History of hyperlipidemia and he states  that Lipitor Statins cause muscle pain and he stopped taking them.  History of hypertension.  Long term medication use.  History of back  surgery.   SOCIAL HISTORY:  He is married.  [place]  No  tobacco, alcohol or illicit drug use.   ALLERGIES:  No known drug allergies.   MEDICATIONS:  1.  Co-enzyme Q10 daily.  2.  Doxazosin 4 mg daily.  3.  Propranolol 40 mg daily.  4.  Diovan/hydrochlorothiazide 160/25, one tablet daily, however, he is only      taking one-half tablet daily by his own decision.  5.  He does take a baby aspirin daily.   FAMILY HISTORY:  Mom died of breast cancer, had a history of skipping  heart.  Dad died at 23 of old age.   PHYSICAL EXAMINATION:  VITAL SIGNS:  Temperature 98.7, blood pressure  151/97, pulse 72, respirations 18.  HEENT:  Grossly normal.  NECK:  No carotid or subclavian bruits.  No jugular venous distention or  thyromegaly.  HEENT:  Sclerae clear. Conjunctiva normal. Nares without drainage.  CHEST:  Clear to auscultation bilaterally.  No wheezing or rhonchi.  HEART:  Regular rate and rhythm, no gross murmur.  ABDOMEN:  Soft, nontender, nondistended, no masses, no bruits, no femoral  bruits.  EXTREMITIES:  No lower extremity edema. Palpable posterior tibial pulses  bilaterally although the right is slightly diminished, more so than the  left.  NEUROLOGICAL:  Cranial nerves II-XII are grossly intact.  SKIN:  Warm and dry.   LABORATORY DATA:  Labs today show potassium 3.5, BUN 16, creatinine 1.2.  Pro Time 14.9, INR 1.2.  Hemoglobin 14.3, hematocrit 42.  In the emergency  room the maximum CK is 161 with MB fraction of 4.6, maximum troponin 0.08.  Chest x-ray showed bibasilar atelectasis, otherwise nonacute.   ASSESSMENT/PLAN:  1.  Unstable angina.  The patient will be placed on intravenous heparin,      intravenous nitroglycerin, beta blocker, aspirin.  We will watch enzymes      closely.  Will plan a cardiac catheterization on April 30, 2006 but      sooner if needed.  2.  Hyperlipidemia. Will check a fasting lipid profile and start Zetia.  3.  Hypertension.  4.  Chronic atrial fibrillation.      Guy Franco, P.A.      Francisca December, M.D.  Electronically Signed    LB/MEDQ  D:  05/02/2006  T:  05/02/2006  Job:  [id number]   cc:   Delaney Meigs, M.D.  Fax: [phone]

## 2011-03-24 NOTE — Cardiovascular Report (Signed)
[name], [name]               ACCOUNT NO.:  [id number]   MEDICAL RECORD NO.:  [id number]          PATIENT TYPE:  OIB   LOCATION:  2918                         FACILITY:  [facility]   PHYSICIAN:  Francisca December, M.D.  DATE OF BIRTH:  09/14/1925   DATE OF PROCEDURE:  06/12/2006  DATE OF DISCHARGE:                              CARDIAC CATHETERIZATION   PROCEDURE PERFORMED:  1. Rotational atherectomy proximal left anterior descending and first      diagonal branch.  2. Percutaneous coronary intervention/drug-eluting stent implantation      proximal left anterior descending and first diagonal branch,      overlapping.  3. Percutaneous closure right femoral artery.   INDICATIONS:  Patrick Potts is a 77 year old man with chronic class II  angina.  He is approximately 4-6 weeks PCI and bare metal stent implantation  in the proximal right coronary/ostium for progressive angina and an STE MI.  He was noted at that time to have severe calcific disease in the LAD and  diagonal branch, 95% stenotic in each.  He is brought to the catheterization  laboratory at this time to complete his revascularization for treatment of  his anginal syndrome.   PROCEDURE NOTE:  The patient was brought to the cardiac catheterization  laboratory in a fasting state.  The right groin was prepped and draped in  the usual sterile fashion.  Local anesthesia was obtained with infiltration  of 1% lidocaine.  A 6-French catheter sheath was inserted percutaneously in  the right femoral vein utilizing an interpreter approach over a guiding J-  wire.  In a similar fashion, an 8 [place] catheter sheath was inserted  percutaneously in the right femoral artery.  The patient received 0.75 mg/kg  bolus of bivalirudin followed by 1.75 mg/kg per hour constant infusion.  The  resulting ACT was approximately 400 seconds.  An 8-French 3.5 ACLS guiding  catheter was advanced to the ascending aorta where the left coronary os was  engaged.  The 0.009 roto-floppy wire was then advanced down the anterior  descending artery.  A 1.5 mm bur was then advanced over this wire and  rotational atherectomy was performed at a speed of 150,000 RPM..  Two runs  were required to fully treat the LAD lesion.  This wire was then withdrawn  and advanced down the diagonal branch.  The 1.5 mm bur was re-introduced  into the proximal anterior descending artery and then rotational atherectomy  performed again at 150,000 RPM.  Three runs were required to pass through  the lesion.  The roto-floppy wire and bur were then removed and each lesion  was rewired with a 0.014 inch Luge intracoronary balloon.  A 2.5/20 mm  balloon was used to pre-dilate the anterior descending artery.  This was  inflated to 6 atmospheres for approximately 30 seconds.  This balloon was  deflated and removed.  Then, a 2.5/16 mm Scimed Taxus drug-eluting  intracoronary stent was advanced and placed in the diagonal branch.  Shortly  thereafter, a 2.5/24 mm Taxus was advanced into the anterior descending  artery.  These stents was  carefully positioned such that the proximal  segments were in the proximal anterior descending artery.  They were  simultaneously inflated to 9 atmospheres for approximately 30 seconds.  Each  balloon was deflated and the patient blood flow was allowed to perfuse for  approximately 3-5 minutes.  The patient was having extensive angina at the  time.  He had good resolution of the discomfort after the stents were  placed.  The stents were then post dilated again with the stent delivery  balloon, the LAD to 14 atmospheres and the diagonal to 12 atmospheres,  again, simultaneously.  The balloons were then deflated and removed.  Following confirmation of adequate patency in orthogonal views, both with  and without the guidewires in place, the guiding catheter was removed.  A  right femoral arteriogram in the 45 degrees RAO angulation documented   adequate placement for the percutaneous closure device AngioSeal.  This was  subsequently deployed with good hemostasis and an intact distal pulse.  The  venous sheath was removed.  I should note that the patient had a 5-French  balloon flow-directed pacing wire placed in the right ventricle prior to  initiation of the rotational atherectomy procedure.  The device did activate  at a rate of 50 beats per minute during the rotational atherectomy.  At the  completion, the temporary pacing wire was removed.   FINAL IMPRESSION:  1. Atherosclerotic cardiovascular disease, three-vessel.  2. Status post successful PCI/DE stent placement proximal overlapping LAD      and diagonal branch with rotational atherectomy pre-treatment.  3. Rather severe angina was reproduced during device insertion and balloon      inflation.  This resolved by the completion of the procedure.   ANGIOGRAPHY:  The lesion was a bifurcation type of lesion with severe  calcification, both in the LAD and diagonal branch.  The LAD lesion was  approximately 18 mm long and the diagonal branch was approximately 12 mm  long.  Each lesion was 95% stenotic.  Following rotational atherectomy and  balloon dilatation and stent implantation, there was no residual stenosis.  There was some compromise of the proximal circumflex due to dilatation of  the proximal anterior descending artery.  This improved with IC  nitroglycerin.   It should be noted that this procedure was complex and difficult.  It  required, as mentioned above, placement of the temporary pacing wire.  The  patient was mildly hypotensive in the 90-100 mmHg systolic pressure range  pre-procedure, he was treated with 750 mL of IV fluids and begun on IV  dopamine infusion which was increased slowly up to 10 mcg/kg per minute.  He  did maintain an adequate pressure on this between 85 and 100 mmHg.  As above, both lesions were treated separately with Rotablator initially.   This  required initial wire passage in the LAD and then redirection into the  diagonal branch.  Subsequently, a dual wire technique was used with dual  stenting in both vessels.  The patient required the additional  administration of 4 mg  midazolam, 100 mg fentanyl, and 1 mg of  hydromorphone.  He also received 4 mg of Zofran for a transient episode of  nausea.  It should be noted the entire procedure including the dictation and  required paperwork took two hours to complete.  90 minutes were required for  treatment of the lesion itself.      Francisca December, M.D.  Electronically Signed     JHE/MEDQ  D:  06/12/2006  T:  06/12/2006  Job:  [id number]   cc:   Delaney Meigs, M.D.

## 2011-03-24 NOTE — Cardiovascular Report (Signed)
Patrick Potts, Patrick Potts               ACCOUNT NO.:  [id number]   MEDICAL RECORD NO.:  [id number]          PATIENT TYPE:  INP   LOCATION:  2911                         FACILITY:  [facility]   PHYSICIAN:  Francisca December, M.D.  DATE OF BIRTH:  06/07/1925   DATE OF PROCEDURE:  04/30/2006  DATE OF DISCHARGE:                              CARDIAC CATHETERIZATION   PROCEDURES PERFORMED:  1.  Left heart catheterization.  2.  Coronary angiography.  3.  Left ventriculogram.  4.  PCI/bare metal stent implantation ostial and proximal RCA.   INDICATIONS:  Patrick Potts is a 77 year old man with a long history of  angina pectoris.  It has recently accelerated and he has had several arrest  episodes over last 24-48 hours.  Initial CK-MB and troponin enzymes have  been negative.  He has been treated overnight with Lovenox and Integrilin.  He is brought to catheterization laboratory at this time to identify the  extent of disease and provide for further therapeutic options.   PROCEDURE NOTE:  The patient is brought to cardiac catheterization  laboratory in a fasting state.  The right groin was prepped and draped in  the usual sterile fashion.  Local anesthesia was obtained with infiltration  of 1% lidocaine.  A long 6-French catheter sheath was inserted  percutaneously into the right femoral artery utilizing an anterior approach  over guiding J-wire.  A 110 cm pigtail catheter was used to measure  pressures in the ascending aorta and in the left ventricle both prior to and  following the ventriculogram.  A 30 degrees RAO cine left ventriculogram was  performed utilizing power injector.  Cineangiography of right and left  coronary arteries was performed using 6-French #4 left and right Judkins  catheters.   I then proceeded with coronary intervention of the ostial right coronary.  The patient also had significant LAD disease.  He received 4100 units of  heparin.  The Integrilin infusion was continued.   The resultant ACT was 376  seconds.  A 6-French #4 left Judkins guiding catheter was advanced to the  ascending aorta where the right coronary os was engaged.  A 0.014 inches  Luge intracoronary guidewire was passed across the lesion without  difficulty.  Initially the lesion was treated with a 3.25/6 mm cutting  balloon.  This was inflated 8 atmospheres for 120 seconds.  This resulted in  transient hypotension and bradycardia.  This was treated with atropine and  dopamine.  As this was being addressed a 4.0/16 mm Scimed Liberte stent was  advanced into place in the proximal right coronary, inflated and deployed at  peak pressure of 16 atmospheres for 17 seconds.  The balloon was deflated  and removed. Shortly thereafter I lost guiding catheter and wire position.  An attempt to rewire the stent with the right Judkins catheter was  unsuccessful.  I then tried a short-tipped right coronary guide catheter.  This did not provide adequate backup to advance the wire sufficiently.  Finally a 6-French LIMA guiding catheter was used which did cannulate the  ostial right coronary. The Luge wire was  re-advanced and successfully  avoided entering through a stent strut by prolapsing the wire.  This was  extremely large right coronary.  At this point I post dilated with a 3.5/12  mm Scimed Maverick balloon.  This was mostly just to adequately cannulate  the right coronary with the guide catheter.  This balloon was deflated and  removed and a 4.0/15 mm Scimed Quantum Maverick was advanced into place.  It  was inflated distally to 12 atmospheres and proximally to 16 atmospheres and  not greater than 30 seconds.  Angiography then demonstrated that I had not  completely covered the lesion distally.  Therefore a 4.0/12 mm Scimed  Liberte bare metal stent was advanced into the more distal segment and  allowed overlap well with the proximal stent.  This was deployed at peak  pressure of 16 atmospheres for 43  seconds.  This balloon was deflated and  removed and post dilatation performed with Quantum Maverick 4.5/15 mm.  This  was primarily more proximal and ostial segment.  It was inflated to 15  atmospheres for 20 seconds.  Following these maneuvers, adequate patency was  confirmed in the LAO projection.  The guiding catheter and guidewire were  removed.  The catheter sheath was sutured into place and the patient is  transported to the recovery area in stable condition with systolic blood  pressure of 100 and a heart rate of 80 in atrial fibrillation.   HEMODYNAMIC RESULTS:  Systemic arterial pressure was 120/80 with mean of 99  mmHg.  There is no systolic gradient across the aortic valve.  Left  ventricular end-diastolic pressure was 17 mmHg.   ANGIOGRAPHY:  The left ventriculogram demonstrated normal chamber size and  normal global systolic function without regional wall motion abnormality.  Visual estimated ejection fraction is 55-60%.  There is left and right  coronary calcification seen.  There is no significant mitral regurgitation.  The aortic valve is trileaflet and opens normally during systole.   There is a right dominant coronary system present. The main left coronary  artery is normal.   The left anterior descending artery and its branches are highly diseased.  There is a bifurcation lesion of the proximal LAD and a proximal diagonal  branch extending approximately 12 mm into each vessel.  There is  calcification and the degree of stenosis in the 90% range of both vessels.  The ongoing anterior descending artery reaches and traverses the apex.   The left circumflex coronary artery was very difficult to visualize and seen  only in a renal fashion.  The LAO cranial and deep cranial shallow RAO  views.  It amounts to two marginal branches in the ongoing circumflex.  These are relatively small to moderate in size.  There is a 90% stenosis in the proximal segment of the first  marginal branch.   The right coronary artery and its branches are highly diseased; this is  extremely large and dominant vessel.  There is an ostial 95% stenosis in the  lesion that extends for approximately 15-20 mm distally.  Only the most  proximal portion is subtotaled though.  The mid right coronary has luminal  irregularities but is an extremely large vessel.  At the acute margin in the  LAO views there appears to be a 50% narrowing.  This is not well visualized  in the RAO views or in the cranial view.  The distal vessel is extremely  large.  Gives rise to large posterior descending artery which  has a 70%  narrowing in its origin and has a large posterolateral segment with two left  ventricular branches.  The second left ventricular branch has a 50%  narrowing in its origin.   Following balloon dilatation and stent implantation there is a 10% residual  stenosis at the ostium of the right coronary.   This procedure was complex and difficult.  The patient developed profound  unfortunately transient hypotension as well as a rapid ventricular response  to atrial fibrillation.  It was very difficult to maintain control of the  wire and guiding catheter at the origin of the right coronary.  It was  difficult to place the stent adequately to cover the ostium and then  extremely difficult to recannulate the artery after the stent had been  placed and subsequently restore wire position.  In addition various  medications to control heart rate and blood pressure were administered and  titrated throughout the case.  He received intravenous fluids.  There was  excessive tortuosity in the pelvic vessels making the advance and  manipulation of guiding catheters even more challenging.  The entire  procedure required 2 hours and 20 minutes to complete.   IMPRESSION:  1.  Atherosclerotic cardiovascular disease, three-vessel.  2.  Intact left ventricular size and global systolic function.  3.   Atrial fibrillation.  4.  Successful PCI/bare metal stent implantation proximal and ostial right      coronary.  5.  Profound transient hypotension and transient rapid ventricular response      to atrial fibrillation.  6.  I made the decision to proceed with PCI in this gentleman with three-      vessel coronary disease due to his adamant refusal to consider coronary      bypass surgery.      Francisca December, M.D.  Electronically Signed     JHE/MEDQ  D:  04/30/2006  T:  04/30/2006  Job:  [id number]   cc:   Delaney Meigs, M.D.  Fax: [phone]

## 2011-07-17 IMAGING — CR DG CHEST 2V
2 series · 2 of 2 positions shown · non-contrast
Comparison: 06/06/2009

CLINICAL DATA: Chest pain.

CHEST - 2 VIEW

[w chest pa]
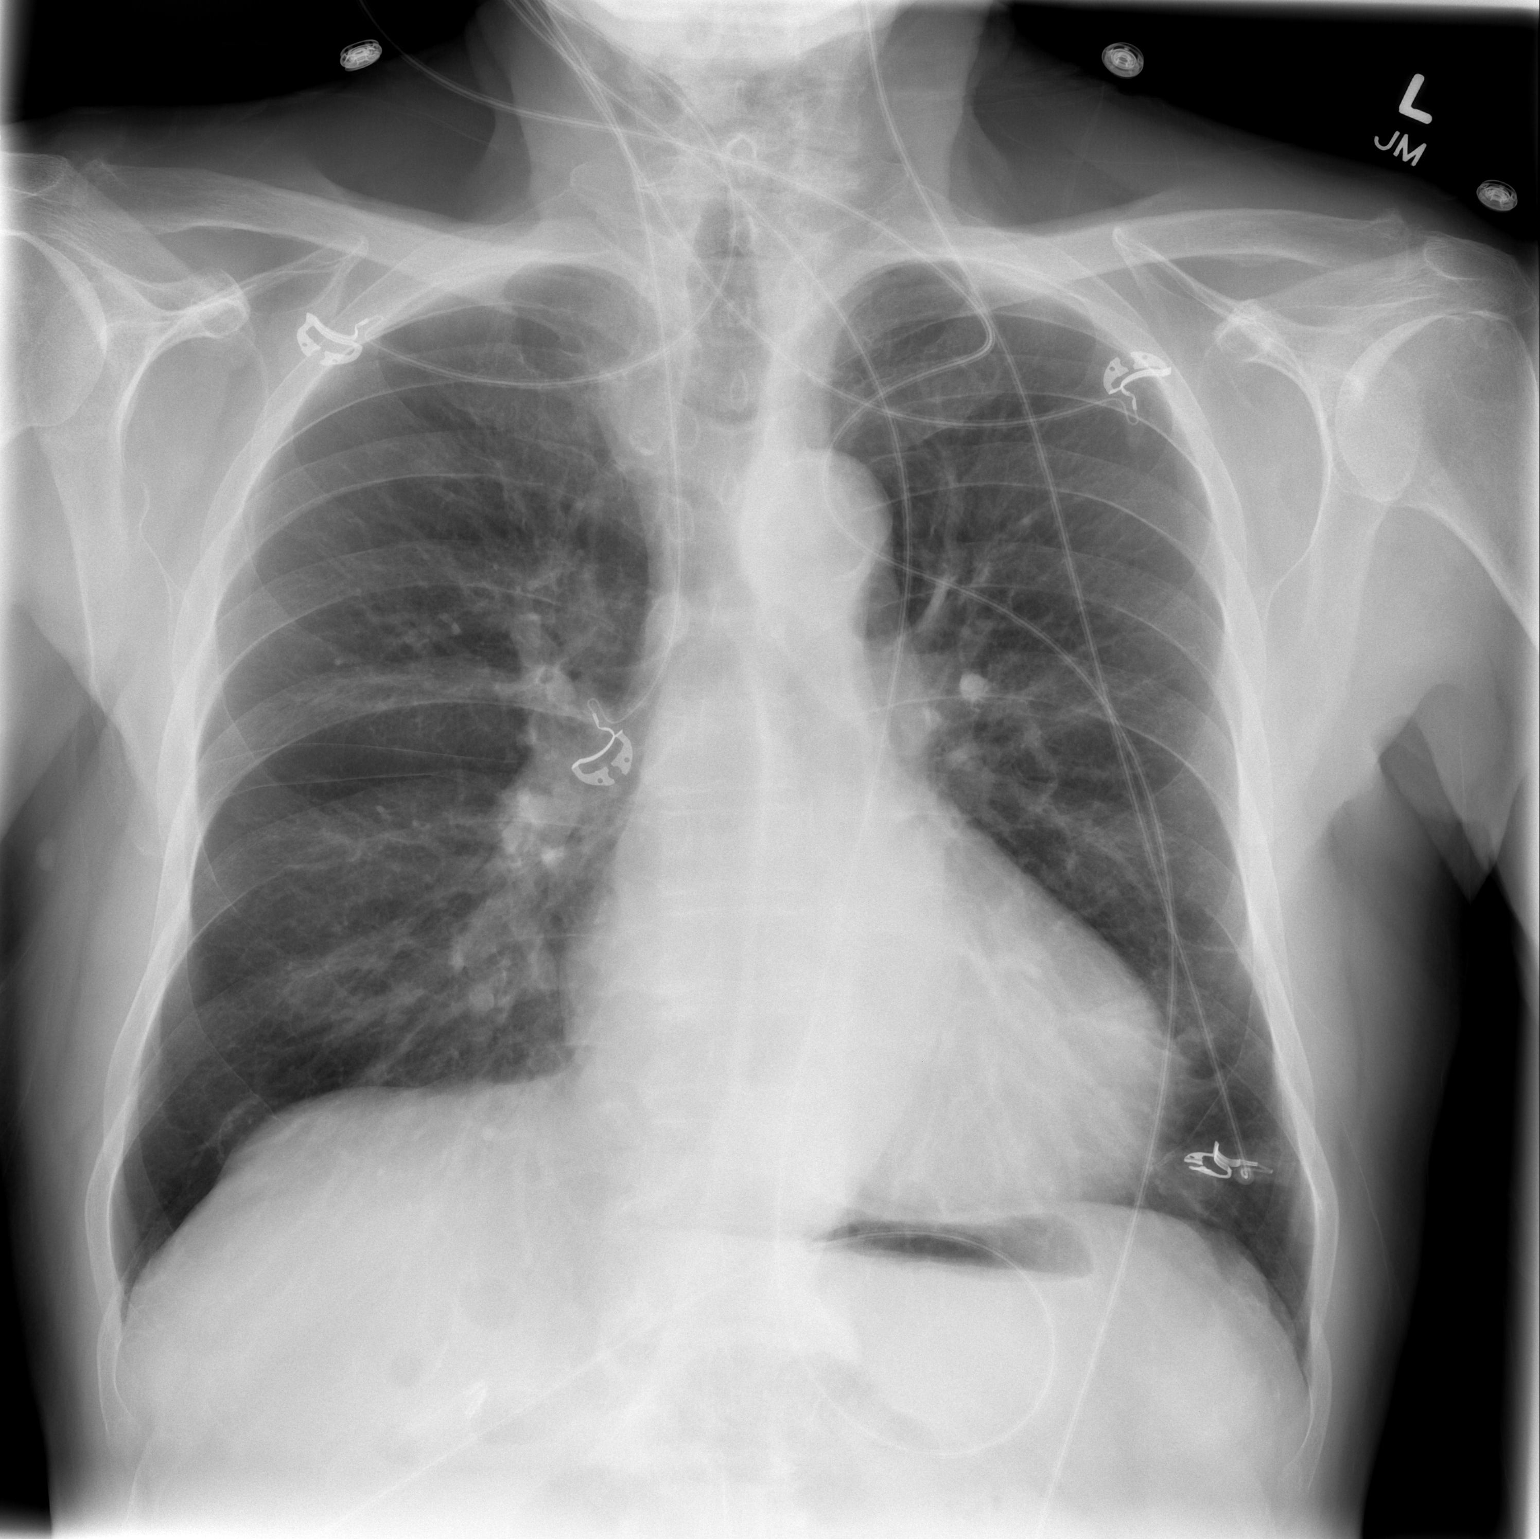

[w chest lat]
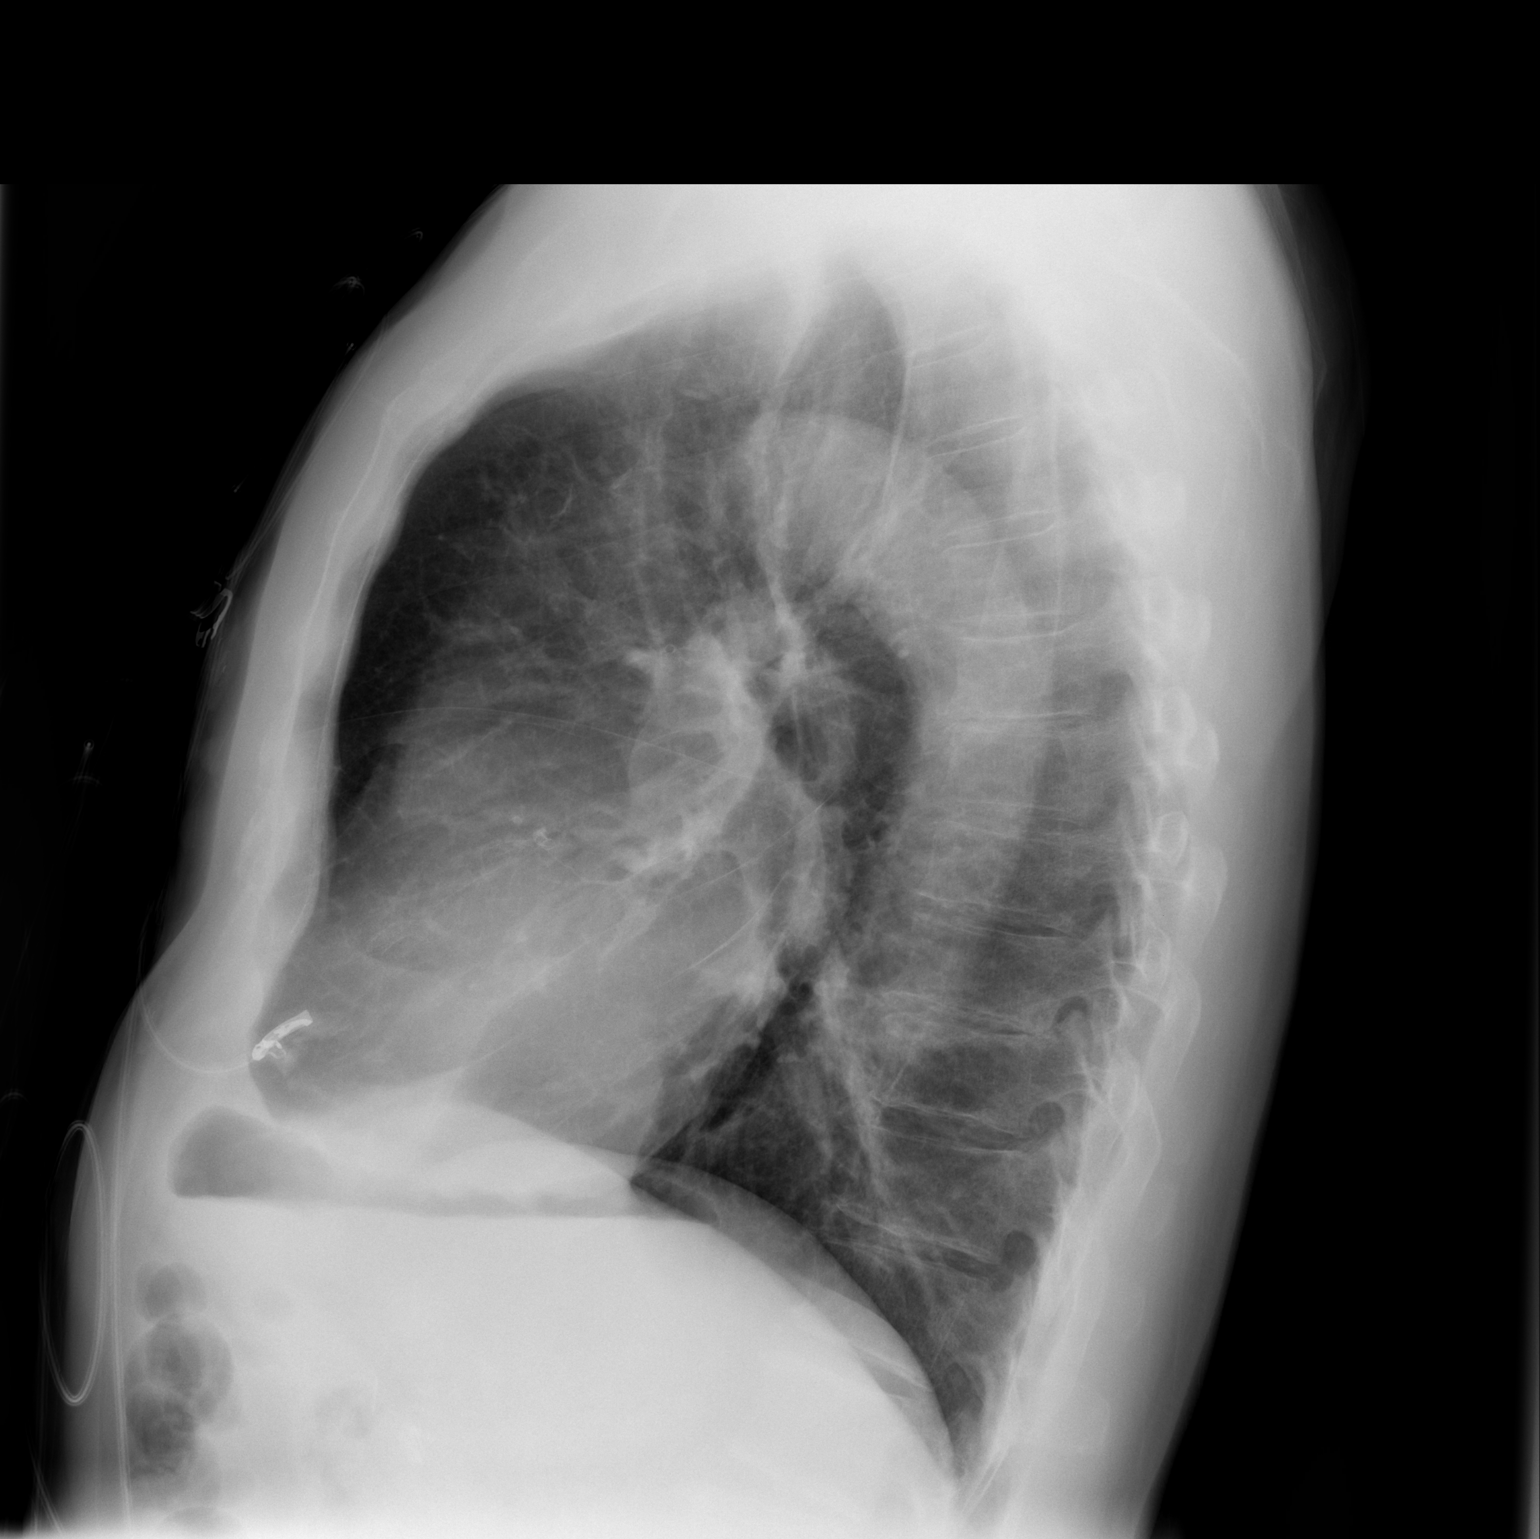

[2 of 2 positions shown; findings below may reference images not displayed]

FINDINGS: The cardiac silhouette, mediastinal and hilar contours
are within normal limits and stable.  There are mild chronic lung
changes but no acute pulmonary findings.  The bony thorax is
intact.
IMPRESSION: No acute cardiopulmonary findings.

## 2013-08-02 ENCOUNTER — Other Ambulatory Visit: Payer: Self-pay | Admitting: Interventional Cardiology

## 2013-08-02 DIAGNOSIS — E7800 Pure hypercholesterolemia, unspecified: Secondary | ICD-10-CM

## 2013-08-02 DIAGNOSIS — Z79899 Other long term (current) drug therapy: Secondary | ICD-10-CM

## 2013-08-07 ENCOUNTER — Other Ambulatory Visit: Payer: Self-pay

## 2013-08-15 ENCOUNTER — Other Ambulatory Visit (INDEPENDENT_AMBULATORY_CARE_PROVIDER_SITE_OTHER): Payer: Medicare Other

## 2013-08-15 DIAGNOSIS — Z79899 Other long term (current) drug therapy: Secondary | ICD-10-CM

## 2013-08-15 DIAGNOSIS — E7800 Pure hypercholesterolemia, unspecified: Secondary | ICD-10-CM

## 2013-08-15 LAB — LIPID PANEL
Cholesterol: 180 mg/dL (ref 0–200)
VLDL: 13.2 mg/dL (ref 0.0–40.0)

## 2013-08-15 LAB — ALT: ALT: 22 U/L (ref 0–53)

## 2013-08-21 ENCOUNTER — Telehealth: Payer: Self-pay | Admitting: Interventional Cardiology

## 2013-08-21 ENCOUNTER — Telehealth: Payer: Self-pay | Admitting: Cardiology

## 2013-08-21 DIAGNOSIS — E7800 Pure hypercholesterolemia, unspecified: Secondary | ICD-10-CM

## 2013-08-21 MED ORDER — ATORVASTATIN CALCIUM 80 MG PO TABS: 80.0000 mg | ORAL_TABLET | Freq: Every day | ORAL | Status: DC

## 2013-08-21 NOTE — Telephone Encounter (Signed)
Message copied by Theda Sers on Thu Aug 21, 2013  3:22 PM ------      Message from: [place], [place]      Created: Mon Aug 18, 2013  5:15 PM       RF: CAD - multi-vessel, HTN, age - LDL goal < 70, non-HDL goal < 100      Meds: Lipitor 40 mg qd.      LDL trending up over past year in patient with h/o multiple PCI.      LDL and non-HDL both need significant further reduction.      ALT normal      Plan:      1.  Increase to atorvastatin 80 mg qd.  Patient should let us know if he develops muscle aches after increasing dose, as would likely need to reduce to 40 mg again at that time.      2.  Recheck lipid panel and hepatic panel in 6 months.      Please notify patient, update meds, and set up lab. Thanks. ------

## 2013-08-21 NOTE — Telephone Encounter (Signed)
Follow Up:  Pt's wife, Chyrl Civatte, states she is returning Amy's call. Please advise

## 2013-09-18 ENCOUNTER — Other Ambulatory Visit: Payer: Self-pay | Admitting: Interventional Cardiology

## 2013-11-08 ENCOUNTER — Other Ambulatory Visit: Payer: Self-pay | Admitting: Interventional Cardiology

## 2014-02-20 ENCOUNTER — Other Ambulatory Visit (INDEPENDENT_AMBULATORY_CARE_PROVIDER_SITE_OTHER): Payer: Medicare Other

## 2014-02-20 DIAGNOSIS — E7800 Pure hypercholesterolemia, unspecified: Secondary | ICD-10-CM

## 2014-02-20 LAB — LIPID PANEL
CHOLESTEROL: 171 mg/dL (ref 0–200)
HDL: 60 mg/dL (ref >39.00)
LDL CALC: 99 mg/dL (ref 0–99)
Total CHOL/HDL Ratio: 3
Triglycerides: 59 mg/dL (ref 0.0–149.0)
VLDL: 11.8 mg/dL (ref 0.0–40.0)

## 2014-03-03 NOTE — Progress Notes (Signed)
Lipids controlled.  Awaiting hepatic?

## 2014-03-09 ENCOUNTER — Encounter: Payer: Self-pay | Admitting: Interventional Cardiology

## 2014-03-10 ENCOUNTER — Other Ambulatory Visit: Payer: Self-pay | Admitting: Cardiology

## 2014-03-10 DIAGNOSIS — E7800 Pure hypercholesterolemia, unspecified: Secondary | ICD-10-CM

## 2014-04-16 ENCOUNTER — Ambulatory Visit: Payer: Self-pay | Admitting: Interventional Cardiology

## 2014-04-17 ENCOUNTER — Ambulatory Visit: Payer: Self-pay | Admitting: Interventional Cardiology

## 2014-04-28 ENCOUNTER — Encounter: Payer: Self-pay | Admitting: Cardiology

## 2014-04-28 DIAGNOSIS — I4891 Unspecified atrial fibrillation: Secondary | ICD-10-CM | POA: Insufficient documentation

## 2014-04-28 DIAGNOSIS — I25118 Atherosclerotic heart disease of native coronary artery with other forms of angina pectoris: Secondary | ICD-10-CM | POA: Insufficient documentation

## 2014-04-28 DIAGNOSIS — I2510 Atherosclerotic heart disease of native coronary artery without angina pectoris: Secondary | ICD-10-CM | POA: Insufficient documentation

## 2014-04-28 DIAGNOSIS — E7800 Pure hypercholesterolemia, unspecified: Secondary | ICD-10-CM | POA: Insufficient documentation

## 2014-04-28 DIAGNOSIS — I10 Essential (primary) hypertension: Secondary | ICD-10-CM

## 2014-04-28 HISTORY — DX: Essential (primary) hypertension: I10

## 2014-05-08 ENCOUNTER — Other Ambulatory Visit: Payer: Self-pay | Admitting: Interventional Cardiology

## 2014-05-11 ENCOUNTER — Encounter: Payer: Self-pay | Admitting: Interventional Cardiology

## 2014-05-11 ENCOUNTER — Ambulatory Visit (INDEPENDENT_AMBULATORY_CARE_PROVIDER_SITE_OTHER): Payer: Medicare Other | Admitting: Interventional Cardiology

## 2014-05-11 VITALS — BP 142/84 | HR 80 | Ht 69.0 in | Wt 161.8 lb

## 2014-05-11 DIAGNOSIS — I10 Essential (primary) hypertension: Secondary | ICD-10-CM

## 2014-05-11 DIAGNOSIS — I482 Chronic atrial fibrillation, unspecified: Secondary | ICD-10-CM

## 2014-05-11 DIAGNOSIS — E7800 Pure hypercholesterolemia, unspecified: Secondary | ICD-10-CM

## 2014-05-11 DIAGNOSIS — I2510 Atherosclerotic heart disease of native coronary artery without angina pectoris: Secondary | ICD-10-CM

## 2014-05-11 DIAGNOSIS — I4891 Unspecified atrial fibrillation: Secondary | ICD-10-CM

## 2014-05-11 NOTE — Progress Notes (Signed)
Patient ID: Patrick Potts, male   DOB: 1925/03/06, 80 y.o.   MRN: [id number]    [place], [place], [place]  [address] Phone: [phone] Fax:  [phone]  Date:  05/11/2014   ID:  Patrick Potts, DOB 1925-07-02, MRN [id number]  PCP:  Sherrie Mustache, MD      History of Present Illness: Patrick Potts is a 80 y.o. male with CAD, several PCI in the past. Dizziness better than before. No sx like what he had prior to stents. CAD/ASCVD:  Works on a [employer] farm.  Still active.  Dizziness while getting up from sitting position has resolved Denies : Chest pain.  Dyspnea on exertion.  Fatigue.  Leg edema.  Nitroglycerin.  Palpitations.  Paroxysmal nocturnal dyspnea.  Shortness of breath.  Syncope.   No bleeding problems.  Wt Readings from Last 3 Encounters:  05/11/14 161 lb 12.8 oz (73.392 kg)     Past Medical History  Diagnosis Date  . ASCVD (arteriosclerotic cardiovascular disease)     multivessel, s/p PCI/BM stent implant, ostial RCA, 04/30/06---s/p PCI/Rotablator-assisted DE stent implant LAD diagonal 06/12/06----known occlusion LCx OM small vessel--Intact overall LVEF 55-60% on cath 05/03/06  . PAF (paroxysmal atrial fibrillation)     becoming chronic [place] score 2   . Myalgia     from statins  . Back pain     remote history  . Atrial fibrillation 04/28/2014  . Essential hypertension, benign 04/28/2014    Current Outpatient Prescriptions  Medication Sig Dispense Refill  . amLODipine (NORVASC) 10 MG tablet TAKE ONE TABLET BY MOUTH ONE TIME DAILY  90 tablet  1  . aspirin 81 MG tablet Take 81 mg by mouth daily.      Marland Kitchen atorvastatin (LIPITOR) 80 MG tablet Take 1 tablet (80 mg total) by mouth daily.  30 tablet  6  . clopidogrel (PLAVIX) 75 MG tablet Take 75 mg by mouth daily with breakfast.      . metoprolol (LOPRESSOR) 50 MG tablet TAKE ONE TABLET BY MOUTH TWICE DAILY  60 tablet  6  . nitroGLYCERIN (NITROSTAT) 0.4 MG SL tablet Place 0.4 mg under  the tongue every 5 (five) minutes as needed for chest pain.      . valsartan-hydrochlorothiazide (DIOVAN-HCT) 160-12.5 MG per tablet Take 1 tablet by mouth daily.       No current facility-administered medications for this visit.    Allergies:   No Known Allergies  Social History:  The patient  reports that he has never smoked. He does not have any smokeless tobacco history on file.   Family History:  The patient's family history is not on file.   ROS:  Please see the history of present illness.  No nausea, vomiting.  No fevers, chills.  No focal weakness.  No dysuria. No internal bleeding   All other systems reviewed and negative.   PHYSICAL EXAM: VS:  BP 142/84  Pulse 80  Ht 5' 9" (1.753 m)  Wt 161 lb 12.8 oz (73.392 kg)  BMI 23.88 kg/m2 Well nourished, well developed, in no acute distress HEENT: normal Neck: no JVD, no carotid bruits Cardiac:  normal S1, S2; RRR;  Lungs:  clear to auscultation bilaterally, no wheezing, rhonchi or rales Abd: soft, nontender, no hepatomegaly Ext: no edema Skin: warm and dry Neuro:   no focal abnormalities noted  EKG:  AFib, rate controlled     ASSESSMENT AND PLAN:  Atrial fibrillation   IMAGING: EKG  AFib, rate controlled.   Notes: Rate controlled. Coumadin discussed in the past. He has preferred to avoid this medication. Tolerating current meds. Wants to avoid bleeding risk. Disussed eliquis today but he is not interested in changing at this time. Taking aspirin and Plavix.  No significant bleeding issues.   2. Coronary atherosclerosis of native coronary artery  Continue Aspirin Tablet, 81 MG, 1 tablet, Orally, Once a day Continue Plavix tablet, 75MG Tablet, 1 tablet, Orally, qd Notes: No angina.    3. Pure hypercholesterolemia  Continue Atorvastatin Calcium Tablet, 80 MG, 1 tablet, Orally, Once a day Notes: LDL 88 in 4/14. LDL 99 in 4/15   4. Essential hypertension, benign  Continue Diovan HCT Tablet, 160-12.5 MG, 1 tablet,  Orally, qd Increase Amlodipine Besylate Tablet, 10MG Tablet, TAKE ONE TABLET BY MOUTH ONE TIME DAILY, orally, daily, 90, Refills 3 Notes: Some elevated readings per his report. Check at home. If readings are above 150/90, let us know. WOuld double Diovan/HCTZ.             Labs 6/14   Lab: Basic Metabolic  GLUCOSE 979 H 89-21 - mg/dL  BUN 21  6-26 - mg/dL  CREATININE 1.10  0.60-1.30 - mg/dl  eGFR (NON-AFRICAN AMERICAN) 64  >60 - calc  eGFR (AFRICAN AMERICAN) 77  >60 - calc  SODIUM 140  136-145 - mmol/L  POTASSIUM 3.4 L 3.5-5.5 - mmol/L  CHLORIDE 105  98-107 - mmol/L  C02 31  22-32 - mmol/L  ANION GAP 7.8  6.0-20.0 - mmol/L  CALCIUM 9.3  8.6-10.3 - mg/dL   ,JAY 04/15/2013 01:08:19 PM > started KCl 20 mEq daily for low potassium at that time- no longer taking         Preventive Medicine  Adult topics discussed:  Diet: healthy diet.  Exercise: 5 days a week, at least 30 minutes of aerobic exercise.      Signed, Mina Marble, MD, [facility] 05/11/2014 8:37 AM

## 2014-05-11 NOTE — Patient Instructions (Signed)
Your physician recommends that you continue on your current medications as directed. Please refer to the Current Medication list given to you today.  Your physician wants you to follow-up in: 1 YEAR WITH DR. VARANASI. You will receive a reminder letter in the mail two months in advance. If you don't receive a letter, please call our office to schedule the follow-up appointment.  

## 2014-05-12 ENCOUNTER — Other Ambulatory Visit: Payer: Self-pay | Admitting: Interventional Cardiology

## 2014-05-12 NOTE — Telephone Encounter (Signed)
amLODipine (NORVASC) 10 MG tablet  TAKE ONE TABLET BY MOUTH ONE TIME DAILY   90 tablet   1  4. Essential hypertension, benign  Continue Diovan HCT Tablet, 160-12.5 MG, 1 tablet, Orally, qd Increase Amlodipine Besylate Tablet, 10MG  Tablet, TAKE ONE TABLET BY MOUTH ONE TIME DAILY, orally, daily, 90, Refills 3 Notes: Some elevated readings per his report. Check at home. If readings are above 150/90, let us know. WOuld double Diovan/HCTZ.  Signed,  Fredric MareJay S. Varanasi, MD, [facility]  05/11/2014 8:37 AM

## 2014-05-28 DIAGNOSIS — E785 Hyperlipidemia, unspecified: Secondary | ICD-10-CM | POA: Insufficient documentation

## 2014-05-28 DIAGNOSIS — I10 Essential (primary) hypertension: Secondary | ICD-10-CM | POA: Insufficient documentation

## 2014-05-28 DIAGNOSIS — I2510 Atherosclerotic heart disease of native coronary artery without angina pectoris: Secondary | ICD-10-CM | POA: Insufficient documentation

## 2014-05-29 ENCOUNTER — Other Ambulatory Visit: Payer: Self-pay | Admitting: Interventional Cardiology

## 2014-05-29 ENCOUNTER — Telehealth: Payer: Self-pay | Admitting: Cardiology

## 2014-05-29 NOTE — Telephone Encounter (Signed)
noted 

## 2014-05-29 NOTE — Telephone Encounter (Signed)
Spoke with pts wife to confirm dosage of atorvastatin for refill. Pt decreased atorvastatin from 80 mg to 40 mg because he started to have myalgias. FYi to [facility]. Meds udpated.

## 2014-07-14 ENCOUNTER — Other Ambulatory Visit: Payer: Self-pay | Admitting: Nurse Practitioner

## 2014-08-11 ENCOUNTER — Other Ambulatory Visit: Payer: Self-pay | Admitting: Interventional Cardiology

## 2014-08-12 ENCOUNTER — Other Ambulatory Visit: Payer: Self-pay | Admitting: Interventional Cardiology

## 2014-12-08 ENCOUNTER — Telehealth: Payer: Self-pay | Admitting: *Deleted

## 2014-12-08 NOTE — Telephone Encounter (Signed)
Called [employer] again for verification on the dosage of the covered medication.  Representative gave me a few other options. 1) Losartan/HCTZ 2) Irbesartan 300mg /HCTZ 12.5mg  3) Telmisartan 40/12.5mg , 80/12.5mg  or 80/25mg   Will forward these new options to Dr. Eldridge DaceVaranasi.

## 2014-12-08 NOTE — Telephone Encounter (Signed)
Documents received from [employer]. Spoke with a representative in regards to what alternative there was for  Valsartan/HCTZ 160-12.5 that would be covered. Representative stated that there were two options: 1) Irbesartan 300mg  and HCTZ 25mg  2) Telmisartan/HCTZ 80mg /25mg  Will forward this information to Dr. Eldridge DaceVaranasi for review and advisement.

## 2014-12-09 MED ORDER — IRBESARTAN-HYDROCHLOROTHIAZIDE 300-12.5 MG PO TABS: 1.0000 | ORAL_TABLET | Freq: Every day | ORAL | Status: DC

## 2014-12-09 NOTE — Telephone Encounter (Signed)
Left message with family member for pt to call back  

## 2014-12-09 NOTE — Telephone Encounter (Signed)
Can use irbesartan/HCTZ 300/12.5 daily

## 2014-12-09 NOTE — Telephone Encounter (Signed)
Spoke with pt in regards to medication change. Made pt aware of new order for Irbesartan/HCTZ 300/12.5mg  daily. Verified pharmacy [facility]) and informed pt I would send over prescription for pick up. Pt verbalized understanding and was in agreement with this plan.

## 2015-01-22 ENCOUNTER — Other Ambulatory Visit: Payer: Self-pay | Admitting: Interventional Cardiology

## 2015-02-22 ENCOUNTER — Other Ambulatory Visit (INDEPENDENT_AMBULATORY_CARE_PROVIDER_SITE_OTHER): Payer: Medicare Other | Admitting: *Deleted

## 2015-02-22 DIAGNOSIS — E7800 Pure hypercholesterolemia, unspecified: Secondary | ICD-10-CM

## 2015-02-22 LAB — HEPATIC FUNCTION PANEL
ALT: 16 U/L (ref 0–53)
AST: 19 U/L (ref 0–37)
Albumin: 4 g/dL (ref 3.5–5.2)
Alkaline Phosphatase: 56 U/L (ref 39–117)
BILIRUBIN DIRECT: 0.3 mg/dL (ref 0.0–0.3)
TOTAL PROTEIN: 6.8 g/dL (ref 6.0–8.3)
Total Bilirubin: 2.1 mg/dL — ABNORMAL HIGH (ref 0.2–1.2)

## 2015-02-22 LAB — LIPID PANEL
Cholesterol: 219 mg/dL — ABNORMAL HIGH (ref 0–200)
HDL: 55.1 mg/dL (ref >39.00)
LDL Cholesterol: 146 mg/dL — ABNORMAL HIGH (ref 0–99)
NonHDL: 163.9
TRIGLYCERIDES: 88 mg/dL (ref 0.0–149.0)
Total CHOL/HDL Ratio: 4
VLDL: 17.6 mg/dL (ref 0.0–40.0)

## 2015-02-23 ENCOUNTER — Other Ambulatory Visit: Payer: Self-pay | Admitting: *Deleted

## 2015-02-23 DIAGNOSIS — I10 Essential (primary) hypertension: Secondary | ICD-10-CM

## 2015-02-23 DIAGNOSIS — E7800 Pure hypercholesterolemia, unspecified: Secondary | ICD-10-CM

## 2015-02-23 MED ORDER — ATORVASTATIN CALCIUM 40 MG PO TABS: 40.0000 mg | ORAL_TABLET | Freq: Every day | ORAL | Status: DC

## 2015-03-22 ENCOUNTER — Other Ambulatory Visit: Payer: Medicare Other

## 2015-05-17 ENCOUNTER — Other Ambulatory Visit (INDEPENDENT_AMBULATORY_CARE_PROVIDER_SITE_OTHER): Payer: Medicare Other | Admitting: *Deleted

## 2015-05-17 ENCOUNTER — Telehealth: Payer: Self-pay | Admitting: *Deleted

## 2015-05-17 DIAGNOSIS — I10 Essential (primary) hypertension: Secondary | ICD-10-CM | POA: Diagnosis not present

## 2015-05-17 DIAGNOSIS — E876 Hypokalemia: Secondary | ICD-10-CM

## 2015-05-17 DIAGNOSIS — E7800 Pure hypercholesterolemia, unspecified: Secondary | ICD-10-CM

## 2015-05-17 LAB — COMPREHENSIVE METABOLIC PANEL
ALBUMIN: 4 g/dL (ref 3.5–5.2)
ALK PHOS: 60 U/L (ref 39–117)
ALT: 18 U/L (ref 0–53)
AST: 22 U/L (ref 0–37)
BUN: 20 mg/dL (ref 6–23)
CALCIUM: 9.6 mg/dL (ref 8.4–10.5)
CHLORIDE: 102 meq/L (ref 96–112)
CO2: 31 mEq/L (ref 19–32)
CREATININE: 1.11 mg/dL (ref 0.40–1.50)
GFR: 66.51 mL/min (ref >60.00)
Glucose, Bld: 118 mg/dL — ABNORMAL HIGH (ref 70–99)
POTASSIUM: 3.2 meq/L — AB (ref 3.5–5.1)
SODIUM: 140 meq/L (ref 135–145)
Total Bilirubin: 3.1 mg/dL — ABNORMAL HIGH (ref 0.2–1.2)
Total Protein: 7.1 g/dL (ref 6.0–8.3)

## 2015-05-17 LAB — LIPID PANEL
Cholesterol: 151 mg/dL (ref 0–200)
HDL: 52.6 mg/dL (ref >39.00)
LDL Cholesterol: 87 mg/dL (ref 0–99)
NonHDL: 98.4
TRIGLYCERIDES: 59 mg/dL (ref 0.0–149.0)
Total CHOL/HDL Ratio: 3
VLDL: 11.8 mg/dL (ref 0.0–40.0)

## 2015-05-17 MED ORDER — POTASSIUM CHLORIDE ER 10 MEQ PO TBCR: 10.0000 meq | EXTENDED_RELEASE_TABLET | Freq: Every day | ORAL | Status: DC

## 2015-05-17 NOTE — Telephone Encounter (Signed)
Informed of lab results and new orders for [facility] and repeat lab work. Scheduled labs for 7/26.

## 2015-05-17 NOTE — Addendum Note (Signed)
Addended by: Chance Karam K on: 05/17/2015 07:49 AM   Modules accepted: Orders  

## 2015-06-01 ENCOUNTER — Other Ambulatory Visit: Payer: Medicare Other

## 2015-06-02 ENCOUNTER — Other Ambulatory Visit: Payer: Self-pay | Admitting: Interventional Cardiology

## 2015-06-03 ENCOUNTER — Other Ambulatory Visit: Payer: Self-pay

## 2015-06-03 ENCOUNTER — Other Ambulatory Visit (INDEPENDENT_AMBULATORY_CARE_PROVIDER_SITE_OTHER): Payer: Medicare Other | Admitting: *Deleted

## 2015-06-03 DIAGNOSIS — E876 Hypokalemia: Secondary | ICD-10-CM

## 2015-06-03 LAB — BASIC METABOLIC PANEL
BUN: 23 mg/dL (ref 6–23)
CO2: 30 mEq/L (ref 19–32)
Calcium: 9.4 mg/dL (ref 8.4–10.5)
Chloride: 105 mEq/L (ref 96–112)
Creatinine, Ser: 1.09 mg/dL (ref 0.40–1.50)
GFR: 67.91 mL/min (ref >60.00)
Glucose, Bld: 129 mg/dL — ABNORMAL HIGH (ref 70–99)
Potassium: 3.7 mEq/L (ref 3.5–5.1)
Sodium: 141 mEq/L (ref 135–145)

## 2015-06-03 MED ORDER — METOPROLOL TARTRATE 50 MG PO TABS: 50.0000 mg | ORAL_TABLET | Freq: Two times a day (BID) | ORAL | Status: DC

## 2015-06-09 ENCOUNTER — Other Ambulatory Visit: Payer: Self-pay | Admitting: Interventional Cardiology

## 2015-07-08 ENCOUNTER — Other Ambulatory Visit: Payer: Self-pay | Admitting: Interventional Cardiology

## 2015-07-13 ENCOUNTER — Other Ambulatory Visit: Payer: Self-pay | Admitting: Interventional Cardiology

## 2015-07-14 ENCOUNTER — Encounter: Payer: Self-pay | Admitting: *Deleted

## 2015-07-14 DIAGNOSIS — E756 Lipid storage disorder, unspecified: Secondary | ICD-10-CM

## 2015-07-14 DIAGNOSIS — E1169 Type 2 diabetes mellitus with other specified complication: Secondary | ICD-10-CM | POA: Insufficient documentation

## 2015-07-16 ENCOUNTER — Encounter: Payer: Self-pay | Admitting: Interventional Cardiology

## 2015-07-16 ENCOUNTER — Other Ambulatory Visit: Payer: Self-pay | Admitting: *Deleted

## 2015-07-16 ENCOUNTER — Ambulatory Visit (INDEPENDENT_AMBULATORY_CARE_PROVIDER_SITE_OTHER): Payer: Medicare Other | Admitting: Interventional Cardiology

## 2015-07-16 VITALS — BP 140/82 | HR 68 | Ht 69.0 in | Wt 160.8 lb

## 2015-07-16 DIAGNOSIS — L989 Disorder of the skin and subcutaneous tissue, unspecified: Secondary | ICD-10-CM

## 2015-07-16 DIAGNOSIS — H9390 Unspecified disorder of ear, unspecified ear: Secondary | ICD-10-CM | POA: Insufficient documentation

## 2015-07-16 DIAGNOSIS — I482 Chronic atrial fibrillation, unspecified: Secondary | ICD-10-CM

## 2015-07-16 DIAGNOSIS — I2510 Atherosclerotic heart disease of native coronary artery without angina pectoris: Secondary | ICD-10-CM | POA: Diagnosis not present

## 2015-07-16 DIAGNOSIS — H61899 Other specified disorders of external ear, unspecified ear: Secondary | ICD-10-CM

## 2015-07-16 DIAGNOSIS — E785 Hyperlipidemia, unspecified: Secondary | ICD-10-CM | POA: Diagnosis not present

## 2015-07-16 DIAGNOSIS — I10 Essential (primary) hypertension: Secondary | ICD-10-CM

## 2015-07-16 NOTE — Progress Notes (Signed)
Patient ID: Patrick Potts, male   DOB: [birth year], 81 y.o.   MRN: [id number]     Cardiology Office Note   Date:  07/16/2015   ID:  Patrick Potts, DOB December 12, 1924, MRN [id number]  PCP:  Josue Hector, MD    No chief complaint on file.  follow-up atrial fibrillation   Wt Readings from Last 3 Encounters:  07/16/15 160 lb 12.8 oz (72.938 kg)  05/11/14 161 lb 12.8 oz (73.392 kg)       History of Present Illness: Patrick Potts is a 81 y.o. male  with CAD, several PCI in the past. Dizziness better than before. No sx like what he had prior to stents.  overall, he feels he is doing well.  CAD/ASCVD:  Works on a Chemical engineer farm. Still active. Dizziness resolved  Denies : Chest pain.  Dyspnea on exertion.  Fatigue.  Leg edema.  Nitroglycerin.  Palpitations.  Paroxysmal nocturnal dyspnea.  Shortness of breath.  Syncope.   No bleeding problems.  He has noticed a growth on his ear.  He thinks it may be getting bigger,  He has no appt with PMD scheduled. His wife sees a Armed forces operational officer. If he needs to see one, he would prefer to see this dermatologist. He continues to prefer to stay on aspirin and Plavix as well. He does not want to consider any newer type of anticoagulation.    Past Medical History  Diagnosis Date  . ASCVD (arteriosclerotic cardiovascular disease)     multivessel, s/p PCI/BM stent implant, ostial RCA, 04/30/06---s/p PCI/Rotablator-assisted DE stent implant LAD diagonal 06/12/06----known occlusion LCx OM small vessel--Intact overall LVEF 55-60% on cath 05/03/06  . PAF (paroxysmal atrial fibrillation)     becoming chronic [place] score 2   . Myalgia     from statins  . Back pain     remote history  . Atrial fibrillation 04/28/2014  . Essential hypertension, benign 04/28/2014    Past Surgical History  Procedure Laterality Date  . Percutaneous coronary rotoblator intervention (pci-r)  05/03/06    multivessel, s/p PCI/BM stent implant, ostial RCA, 04/30/06---s/p  PCI/Rotablator-assisted DE stent implant LAD diagonal 06/12/06----known occlusion LCx OM small vessel--Intact overall LVEF 55-60% on cath 05/03/06  . Cardiac catheterization  05/03/06     Current Outpatient Prescriptions  Medication Sig Dispense Refill  . amLODipine (NORVASC) 10 MG tablet TAKE ONE TABLET BY MOUTH ONE TIME DAILY 90 tablet 3  . aspirin 81 MG tablet Take 81 mg by mouth daily.    Marland Kitchen atorvastatin (LIPITOR) 40 MG tablet Take 1 tablet (40 mg total) by mouth daily at 6 PM. 30 tablet 11  . clopidogrel (PLAVIX) 75 MG tablet TAKE ONE TABLET BY MOUTH ONE TIME DAILY 30 tablet 1  . irbesartan-hydrochlorothiazide (AVALIDE) 300-12.5 MG per tablet TAKE ONE TABLET BY MOUTH ONE TIME DAILY 30 tablet 0  . metoprolol (LOPRESSOR) 50 MG tablet Take 1 tablet (50 mg total) by mouth 2 (two) times daily. 60 tablet 6  . NITROSTAT 0.4 MG SL tablet DISSOLVE 1 TABLET UNDER TONGUE REPEAT EVERY 5 MINUTES UP TO 3 TIMES AS DIRECTED FOR CHEST PAIN 25 tablet 4  . potassium chloride (K-DUR) 10 MEQ tablet Take 1 tablet (10 mEq total) by mouth daily. 90 tablet 1   No current facility-administered medications for this visit.    Allergies:   Review of patient's allergies indicates no known allergies.    Social History:  The patient  reports that he has never smoked. He does not have  any smokeless tobacco history on file.   Family History:  The patient's family history includes Arrhythmia in his mother; Breast cancer in his mother; Healthy in his father.    ROS:  Please see the history of present illness.   Otherwise, review of systems are positive for lesion on ear.   All other systems are reviewed and negative.    PHYSICAL EXAM: VS:  BP 140/82 mmHg  Pulse 68  Ht  (1.753 m)  Wt 160 lb 12.8 oz (72.938 kg)  BMI 23.74 kg/m2 , BMI Body mass index is 23.74 kg/(m^2). GEN: Well nourished, well developed, in no acute distress HEENT: normal Neck: no JVD, carotid bruits, or masses Cardiac: irregularly irregular;  no murmurs, rubs, or gallops,no edema  Respiratory:  clear to auscultation bilaterally, normal work of breathing GI: soft, nontender, nondistended, + BS MS: no deformity or atrophy Skin: warm and dry, no rash, yellowish growth on the right ear with some skin breakdown Neuro:  Strength and sensation are intact Psych: euthymic mood, full affect   EKG:   The ekg ordered today demonstrates AFib, RBBB, rate controlled   Recent Labs: 05/17/2015: ALT 18 06/03/2015: BUN 23; Creatinine, Ser 1.09; Potassium 3.7; Sodium 141   Lipid Panel    Component Value Date/Time   CHOL 151 05/17/2015 0749   TRIG 59.0 05/17/2015 0749   HDL 52.60 05/17/2015 0749   CHOLHDL 3 05/17/2015 0749   VLDL 11.8 05/17/2015 0749   LDLCALC 87 05/17/2015 0749     Other studies Reviewed: Additional studies/ records that were reviewed today with results demonstrating control lipids in July 2016.   ASSESSMENT AND PLAN:      1. AFib: rate controlled.  Declines stronger anticoagulation.  Prefers to stay on AA and plavix.  2. Right ear lesion: concerning for a skin cancer.  Will send to dermatology.  He prefers to go to the dermatologist that his wife sees.  Will try to get that name from her a refer.  3. HTN/hyperlipidemia: He was confused about his meds.  He had stopped atorvastatin when he started irbesartan thinking it was a substitute.  He will restart atorvastatin.  Most recent lipids were well controlled. 4. Coronary artery disease: Continue dual antiplatelet therapy. No bleeding issues. No angina.   Current medicines are reviewed at length with the patient today.  The patient concerns regarding his medicines were addressed.  The following changes have been made:  No change  Labs/ tests ordered today include: refer to derm No orders of the defined types were placed in this encounter.    Recommend 150 minutes/week of aerobic exercise Low fat, low carb, high fiber diet recommended  Disposition:   FU in 1  year   Delorise Jackson., MD  07/16/2015 8:16 AM    [facility] [address] [place], [place], [place]  [address] Phone: [phone]; Fax: [phone]

## 2015-07-16 NOTE — Patient Instructions (Signed)
Medication Instructions:  None  Labwork: None  Testing/Procedures: None  Follow-Up: Your physician wants you to follow-up in: 1 year with Dr. Eldridge Dace. You will receive a reminder letter in the mail two months in advance. If you don't receive a letter, please call our office to schedule the follow-up appointment.   Any Other Special Instructions Will Be Listed Below (If Applicable).  You have been referred to Dermatology

## 2015-07-19 ENCOUNTER — Other Ambulatory Visit: Payer: Self-pay | Admitting: Interventional Cardiology

## 2015-08-06 ENCOUNTER — Other Ambulatory Visit: Payer: Self-pay | Admitting: Interventional Cardiology

## 2015-11-15 ENCOUNTER — Other Ambulatory Visit: Payer: Self-pay | Admitting: Interventional Cardiology

## 2015-12-09 ENCOUNTER — Other Ambulatory Visit: Payer: Self-pay

## 2015-12-09 MED ORDER — POTASSIUM CHLORIDE ER 10 MEQ PO TBCR: 10.0000 meq | EXTENDED_RELEASE_TABLET | Freq: Every day | ORAL | Status: DC

## 2015-12-09 NOTE — Telephone Encounter (Signed)
Corky Crafts, MD at 07/16/2015 8:15 AM  potassium chloride (K-DUR) 10 MEQ tabletTake 1 tablet (10 mEq total) by mouth daily Current medicines are reviewed at length with the patient today. The patient concerns regarding his medicines were addressed.  The following changes have been made: No change  Notes Recorded by Corky Crafts, MD on 06/03/2015 at 4:23 PM Potassium improved. COntinue current meds

## 2016-03-02 ENCOUNTER — Other Ambulatory Visit: Payer: Self-pay | Admitting: Interventional Cardiology

## 2016-04-04 ENCOUNTER — Other Ambulatory Visit: Payer: Self-pay | Admitting: Interventional Cardiology

## 2016-07-16 NOTE — Progress Notes (Signed)
Patient ID: Patrick Potts, male   DOB: 09-13-1925, 82 y.o.   MRN: [id number]     Cardiology Office Note   Date:  07/17/2016   ID:  Patrick Potts, DOB [birth year], MRN [id number]  PCP:  Josue Hector, MD    No chief complaint on file.  follow-up atrial fibrillation   Wt Readings from Last 3 Encounters:  07/17/16 167 lb (75.8 kg)  07/16/15 160 lb 12.8 oz (72.9 kg)  05/11/14 161 lb 12.8 oz (73.4 kg)       History of Present Illness: Patrick Potts is a 82 y.o. male  with CAD, several PCI in the past. No further Dizziness. No sx like what he had prior to stents.  overall, he feels he is doing well.  CAD/ASCVD:  Works on a Chemical engineer farm. Still active.  Dizziness resolved  Denies : Chest pain.  Dyspnea on exertion.  Fatigue.  Leg edema.  Nitroglycerin.  Palpitations.  Paroxysmal nocturnal dyspnea.  Shortness of breath.  Syncope.   No bleeding problems.  He had noticed a growth on his ear- evaluated by dermatology and found to be benign.     He continues to prefer to stay on aspirin and Plavix as well. He does not want to consider any newer type of anticoagulation for AFib.  BP at home is checked by his wife.  Readings have been controlled there.  His wife is a retired Engineer, civil (consulting).    Past Medical History:  Diagnosis Date  . ASCVD (arteriosclerotic cardiovascular disease)    multivessel, s/p PCI/BM stent implant, ostial RCA, 04/30/06---s/p PCI/Rotablator-assisted DE stent implant LAD diagonal 06/12/06----known occlusion LCx OM small vessel--Intact overall LVEF 55-60% on cath 05/03/06  . Atrial fibrillation (HCC) 04/28/2014  . Back pain    remote history  . Essential hypertension, benign 04/28/2014  . Myalgia    from statins  . PAF (paroxysmal atrial fibrillation) (HCC)    becoming chronic [place] score 2     Past Surgical History:  Procedure Laterality Date  . CARDIAC CATHETERIZATION  05/03/06  . PERCUTANEOUS CORONARY ROTOBLATOR INTERVENTION (PCI-R)  05/03/06   multivessel, s/p PCI/BM stent implant, ostial RCA, 04/30/06---s/p PCI/Rotablator-assisted DE stent implant LAD diagonal 06/12/06----known occlusion LCx OM small vessel--Intact overall LVEF 55-60% on cath 05/03/06     Current Outpatient Prescriptions  Medication Sig Dispense Refill  . amLODipine (NORVASC) 10 MG tablet TAKE ONE TABLET BY MOUTH ONE TIME DAILY 90 tablet 2  . aspirin 81 MG tablet Take 81 mg by mouth daily.    Marland Kitchen atorvastatin (LIPITOR) 40 MG tablet TAKE 1 TABLET ONCE DAILY AT 6PM 30 tablet 3  . clopidogrel (PLAVIX) 75 MG tablet TAKE ONE TABLET BY MOUTH ONE TIME DAILY 30 tablet 10  . irbesartan-hydrochlorothiazide (AVALIDE) 300-12.5 MG per tablet TAKE ONE TABLET BY MOUTH ONE TIME DAILY 30 tablet 11  . metoprolol (LOPRESSOR) 50 MG tablet TAKE  (1)  TABLET TWICE A DAY. 60 tablet 11  . NITROSTAT 0.4 MG SL tablet DISSOLVE 1 TABLET UNDER TONGUE REPEAT EVERY 5 MINUTES UP TO 3 TIMES AS DIRECTED FOR CHEST PAIN 25 tablet 4  . potassium chloride (K-DUR) 10 MEQ tablet Take 1 tablet (10 mEq total) by mouth daily. 90 tablet 1   No current facility-administered medications for this visit.     Allergies:   Review of patient's allergies indicates no known allergies.    Social History:  The patient  reports that he has never smoked. He does not have any smokeless tobacco history  on file.   Family History:  The patient's family history includes Arrhythmia in his mother; Breast cancer in his mother; Healthy in his father.    ROS:  Please see the history of present illness.   Otherwise, review of systems are positive for lesion on ear being removed in the past year.   All other systems are reviewed and negative.    PHYSICAL EXAM: VS:  BP (!) 150/80 (BP Location: Right Arm, Patient Position: Sitting, Cuff Size: Normal)   Pulse 77   Ht 5\' 9"  (1.753 m)   Wt 167 lb (75.8 kg)   BMI 24.66 kg/m  , BMI Body mass index is 24.66 kg/m. GEN: Well nourished, well developed, in no acute distress  HEENT:  normal  Neck: no JVD, carotid bruits, or masses Cardiac: irregularly irregular; no murmurs, rubs, or gallops,no edema  Respiratory:  clear to auscultation bilaterally, normal work of breathing GI: soft, nontender, nondistended, + BS MS: no deformity or atrophy  Skin: warm and dry, no rash, yellowish growth on the right ear with some skin breakdown Neuro:  Strength and sensation are intact Psych: euthymic mood, full affect   EKG:   The ekg ordered today demonstrates AFib, RBBB, rate controlled, PVCs   Recent Labs: No results found for requested labs within last 8760 hours.   Lipid Panel    Component Value Date/Time   CHOL 151 05/17/2015 0749   TRIG 59.0 05/17/2015 0749   HDL 52.60 05/17/2015 0749   CHOLHDL 3 05/17/2015 0749   VLDL 11.8 05/17/2015 0749   LDLCALC 87 05/17/2015 0749     Other studies Reviewed: Additional studies/ records that were reviewed today with results demonstrating control lipids in July 2016.   ASSESSMENT AND PLAN:      1. AFib: rate controlled.  Declines stronger anticoagulation.  Prefers to stay on ASA and plavix. No sx.   2. Right ear lesion: resolved after visit with Derm. 3. HTN/hyperlipidemia: He restarted atorvastatin.  Most recent lipids in 2016 were well controlled.  Recheck lipids.  BP 150/80 on recheck.  Check electrolytes.  He should let us know if home BPs are higher than this value.  Wife is a Engineer, civil (consulting)nurse. 4. Coronary artery disease: Continue dual antiplatelet therapy. No bleeding issues. No angina.   Current medicines are reviewed at length with the patient today.  The patient concerns regarding his medicines were addressed.  The following changes have been made:  No change  Labs/ tests ordered today include: refer to derm No orders of the defined types were placed in this encounter.   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

## 2016-07-17 ENCOUNTER — Ambulatory Visit (INDEPENDENT_AMBULATORY_CARE_PROVIDER_SITE_OTHER): Payer: Medicare Other | Admitting: Interventional Cardiology

## 2016-07-17 ENCOUNTER — Encounter: Payer: Self-pay | Admitting: Interventional Cardiology

## 2016-07-17 VITALS — BP 150/80 | HR 77 | Ht 69.0 in | Wt 167.0 lb

## 2016-07-17 DIAGNOSIS — E785 Hyperlipidemia, unspecified: Secondary | ICD-10-CM

## 2016-07-17 DIAGNOSIS — I10 Essential (primary) hypertension: Secondary | ICD-10-CM | POA: Diagnosis not present

## 2016-07-17 DIAGNOSIS — I2510 Atherosclerotic heart disease of native coronary artery without angina pectoris: Secondary | ICD-10-CM

## 2016-07-17 DIAGNOSIS — I482 Chronic atrial fibrillation, unspecified: Secondary | ICD-10-CM

## 2016-07-17 LAB — COMPREHENSIVE METABOLIC PANEL
ALBUMIN: 4.1 g/dL (ref 3.6–5.1)
ALK PHOS: 54 U/L (ref 40–115)
ALT: 20 U/L (ref 9–46)
AST: 22 U/L (ref 10–35)
BUN: 19 mg/dL (ref 7–25)
CALCIUM: 9.5 mg/dL (ref 8.6–10.3)
CHLORIDE: 102 mmol/L (ref 98–110)
CO2: 28 mmol/L (ref 20–31)
Creat: 1.11 mg/dL (ref 0.70–1.11)
Glucose, Bld: 168 mg/dL — ABNORMAL HIGH (ref 65–99)
POTASSIUM: 3.5 mmol/L (ref 3.5–5.3)
Sodium: 141 mmol/L (ref 135–146)
TOTAL PROTEIN: 6.7 g/dL (ref 6.1–8.1)
Total Bilirubin: 2.6 mg/dL — ABNORMAL HIGH (ref 0.2–1.2)

## 2016-07-17 LAB — LIPID PANEL
CHOL/HDL RATIO: 2.6 ratio (ref <=5.0)
CHOLESTEROL: 165 mg/dL (ref 125–200)
HDL: 63 mg/dL (ref >=40)
LDL Cholesterol: 87 mg/dL (ref <130)
TRIGLYCERIDES: 75 mg/dL (ref <150)
VLDL: 15 mg/dL (ref <30)

## 2016-07-17 NOTE — Patient Instructions (Signed)
Your physician recommends that you continue on your current medications as directed. Please refer to the Current Medication list given to you today.  Your physician recommends that you return for lab work today (CMET, LIPIDS)  Your physician wants you to follow-up in: 1 year with Dr. Varanasi.  You will receive a reminder letter in the mail two months in advance. If you don't receive a letter, please call our office to schedule the follow-up appointment.  

## 2016-08-09 ENCOUNTER — Other Ambulatory Visit: Payer: Self-pay | Admitting: *Deleted

## 2016-08-09 MED ORDER — CLOPIDOGREL BISULFATE 75 MG PO TABS: 75.0000 mg | ORAL_TABLET | Freq: Every day | ORAL | 11 refills | Status: DC

## 2016-08-30 ENCOUNTER — Other Ambulatory Visit: Payer: Self-pay | Admitting: Interventional Cardiology

## 2016-09-26 ENCOUNTER — Other Ambulatory Visit: Payer: Self-pay | Admitting: Interventional Cardiology

## 2016-09-26 MED ORDER — ATORVASTATIN CALCIUM 40 MG PO TABS: ORAL_TABLET | ORAL | 9 refills | Status: DC

## 2016-10-17 ENCOUNTER — Other Ambulatory Visit: Payer: Self-pay | Admitting: Interventional Cardiology

## 2017-03-08 ENCOUNTER — Encounter (HOSPITAL_COMMUNITY): Payer: Self-pay | Admitting: *Deleted

## 2017-03-08 ENCOUNTER — Emergency Department (HOSPITAL_COMMUNITY): Payer: Medicare Other

## 2017-03-08 ENCOUNTER — Emergency Department (HOSPITAL_COMMUNITY)
Admission: EM | Admit: 2017-03-08 | Discharge: 2017-03-08 | Disposition: A | Discharge status: Home / Self Care | Payer: Medicare Other | Attending: Emergency Medicine | Admitting: Emergency Medicine

## 2017-03-08 DIAGNOSIS — Z79899 Other long term (current) drug therapy: Secondary | ICD-10-CM | POA: Insufficient documentation

## 2017-03-08 DIAGNOSIS — I2510 Atherosclerotic heart disease of native coronary artery without angina pectoris: Secondary | ICD-10-CM | POA: Insufficient documentation

## 2017-03-08 DIAGNOSIS — R2681 Unsteadiness on feet: Secondary | ICD-10-CM | POA: Diagnosis not present

## 2017-03-08 DIAGNOSIS — E119 Type 2 diabetes mellitus without complications: Secondary | ICD-10-CM | POA: Diagnosis not present

## 2017-03-08 DIAGNOSIS — Z7982 Long term (current) use of aspirin: Secondary | ICD-10-CM | POA: Diagnosis not present

## 2017-03-08 DIAGNOSIS — R42 Dizziness and giddiness: Secondary | ICD-10-CM

## 2017-03-08 DIAGNOSIS — Z5181 Encounter for therapeutic drug level monitoring: Secondary | ICD-10-CM | POA: Diagnosis not present

## 2017-03-08 DIAGNOSIS — I10 Essential (primary) hypertension: Secondary | ICD-10-CM | POA: Insufficient documentation

## 2017-03-08 DIAGNOSIS — S0541XA Penetrating wound of orbit with or without foreign body, right eye, initial encounter: Secondary | ICD-10-CM | POA: Diagnosis not present

## 2017-03-08 DIAGNOSIS — Z1810 Retained metal fragments, unspecified: Secondary | ICD-10-CM

## 2017-03-08 LAB — CBC
HEMATOCRIT: 44.8 % (ref 39.0–52.0)
Hemoglobin: 15.6 g/dL (ref 13.0–17.0)
MCH: 31.8 pg (ref 26.0–34.0)
MCHC: 34.8 g/dL (ref 30.0–36.0)
MCV: 91.4 fL (ref 78.0–100.0)
Platelets: 160 10*3/uL (ref 150–400)
RBC: 4.9 MIL/uL (ref 4.22–5.81)
RDW: 12.6 % (ref 11.5–15.5)
WBC: 7.3 10*3/uL (ref 4.0–10.5)

## 2017-03-08 LAB — RAPID URINE DRUG SCREEN, HOSP PERFORMED
Amphetamines: NOT DETECTED
Barbiturates: NOT DETECTED
Benzodiazepines: NOT DETECTED
Cocaine: NOT DETECTED
OPIATES: NOT DETECTED
Tetrahydrocannabinol: NOT DETECTED

## 2017-03-08 LAB — URINALYSIS, ROUTINE W REFLEX MICROSCOPIC
BACTERIA UA: NONE SEEN
Bilirubin Urine: NEGATIVE
Glucose, UA: 150 mg/dL — AB
Ketones, ur: NEGATIVE mg/dL
Leukocytes, UA: NEGATIVE
Nitrite: NEGATIVE
PH: 5 (ref 5.0–8.0)
Protein, ur: NEGATIVE mg/dL
SPECIFIC GRAVITY, URINE: 1.012 (ref 1.005–1.030)
WBC, UA: NONE SEEN WBC/hpf (ref 0–5)

## 2017-03-08 LAB — BASIC METABOLIC PANEL
Anion gap: 10 (ref 5–15)
BUN: 17 mg/dL (ref 6–20)
CHLORIDE: 104 mmol/L (ref 101–111)
CO2: 28 mmol/L (ref 22–32)
Calcium: 10.1 mg/dL (ref 8.9–10.3)
Creatinine, Ser: 0.99 mg/dL (ref 0.61–1.24)
GFR calc Af Amer: >60 mL/min (ref >60)
GLUCOSE: 155 mg/dL — AB (ref 65–99)
POTASSIUM: 3.5 mmol/L (ref 3.5–5.1)
Sodium: 142 mmol/L (ref 135–145)

## 2017-03-08 LAB — I-STAT TROPONIN, ED: Troponin i, poc: 0 ng/mL (ref 0.00–0.08)

## 2017-03-08 LAB — PROTIME-INR
INR: 1.01
Prothrombin Time: 13.3 seconds (ref 11.4–15.2)

## 2017-03-08 LAB — APTT: APTT: 29 s (ref 24–36)

## 2017-03-08 MED ORDER — IOPAMIDOL (ISOVUE-370) INJECTION 76%
75.0000 mL | Freq: Once | INTRAVENOUS | Status: AC | PRN
  Administered 2017-03-08: 75 mL via INTRAVENOUS

## 2017-03-08 MED ORDER — MECLIZINE HCL 12.5 MG PO TABS: 12.5000 mg | ORAL_TABLET | Freq: Three times a day (TID) | ORAL | 0 refills | Status: DC | PRN

## 2017-03-08 NOTE — ED Provider Notes (Signed)
[facility] DEPT Provider Note   CSN: [id number] Arrival date & time: 03/08/17  1620     History   Chief Complaint Chief Complaint  Patient presents with  . Dizziness    HPI Patrick Potts is a 83 y.o. male.  HPI  83 year old male with a history of atrial fibrillation and coronary artery disease with stents presents with dizziness. History taken from patient and family. Patient has been having on and off dizziness described as an unsteadiness and feeling off balanced for at least 2 weeks. Does not occur every day. However since this morning the dizziness has been persistent and worse. When he rests and lay still it is gone. However getting up he feels quite dizzy. He's been taking Dramamine on and off for the symptoms. He has not seen a physician for this. Today's dizziness was worse. It does not feel like he's going to pass out.At no time has he had headaches, blurry vision, nausea, vomiting, diarrhea, chest pain, or shortness of breath. No weakness or numbness. Denies any recent illnesses, ear pain, or tinnitus. Chronic left eye problems from injury as a child. Prior to me seeing him he walked to the bathroom unassisted. Was not swaying per family. Held onto rail for support but did not need help. Went to urgent care, sent here for workup due to his comorbidities. Has a history of afib, is on a blood thinner, unsure which one per family (plavix is only one on list). No falls or recent head injuries.  Past Medical History:  Diagnosis Date  . ASCVD (arteriosclerotic cardiovascular disease)    multivessel, s/p PCI/BM stent implant, ostial RCA, 04/30/06---s/p PCI/Rotablator-assisted DE stent implant LAD diagonal 06/12/06----known occlusion LCx OM small vessel--Intact overall LVEF 55-60% on cath 05/03/06  . Atrial fibrillation (HCC) 04/28/2014  . Back pain    remote history  . Essential hypertension, benign 04/28/2014  . Myalgia    from statins  . PAF (paroxysmal atrial fibrillation) (HCC)      becoming chronic Italychad score 2     Patient Active Problem List   Diagnosis Date Noted  . Ear lesion 07/16/2015  . Diabetes (HCC) 07/14/2015  . Arteriosclerosis of coronary artery 05/28/2014  . HLD (hyperlipidemia) 05/28/2014  . BP (high blood pressure) 05/28/2014  . Atrial fibrillation (HCC) 04/28/2014  . Coronary atherosclerosis of native coronary artery 04/28/2014  . Essential hypertension, benign 04/28/2014  . Pure hypercholesterolemia 04/28/2014    Past Surgical History:  Procedure Laterality Date  . CARDIAC CATHETERIZATION  05/03/06  . PERCUTANEOUS CORONARY ROTOBLATOR INTERVENTION (PCI-R)  05/03/06   multivessel, s/p PCI/BM stent implant, ostial RCA, 04/30/06---s/p PCI/Rotablator-assisted DE stent implant LAD diagonal 06/12/06----known occlusion LCx OM small vessel--Intact overall LVEF 55-60% on cath 05/03/06       Home Medications    Prior to Admission medications   Medication Sig Start Date End Date Taking? Authorizing Provider  amLODipine (NORVASC) 10 MG tablet Take 1 Tablet by mouth once daily 10/17/16   Corky CraftsJayadeep S Varanasi, MD  aspirin 81 MG tablet Take 81 mg by mouth daily.    Historical Provider, MD  atorvastatin (LIPITOR) 40 MG tablet TAKE 1 TABLET ONCE DAILY AT [facility]6PM 09/26/16   Corky CraftsJayadeep S Varanasi, MD  clopidogrel (PLAVIX) 75 MG tablet Take 1 tablet (75 mg total) by mouth daily. 08/09/16   Corky CraftsJayadeep S Varanasi, MD  irbesartan-hydrochlorothiazide (AVALIDE) 300-12.5 MG tablet Take 1 tablet by mouth daily. 08/30/16   Corky CraftsJayadeep S Varanasi, MD  meclizine (ANTIVERT) 12.5 MG tablet Take 1  tablet (12.5 mg total) by mouth 3 (three) times daily as needed for dizziness. 03/08/17   Pricilla Loveless, MD  metoprolol (LOPRESSOR) 50 MG tablet TAKE  (1)  TABLET TWICE A DAY. 04/04/16   Corky Crafts, MD  NITROSTAT 0.4 MG SL tablet DISSOLVE 1 TABLET UNDER TONGUE REPEAT EVERY 5 MINUTES UP TO 3 TIMES AS DIRECTED FOR CHEST PAIN 06/03/15   Corky Crafts, MD  potassium chloride (K-DUR) 10  MEQ tablet Take 1 tablet (10 mEq total) by mouth daily. 12/09/15   Corky Crafts, MD    Family History Family History  Problem Relation Age of Onset  . Arrhythmia Mother   . Breast cancer Mother   . Healthy Father     Social History Social History  Substance Use Topics  . Smoking status: Never Smoker  . Smokeless tobacco: Never Used  . Alcohol use No     Allergies   Patient has no known allergies.   Review of Systems Review of Systems  Constitutional: Negative for fever.  Eyes: Negative for visual disturbance.  Respiratory: Negative for shortness of breath.   Cardiovascular: Negative for chest pain.  Gastrointestinal: Negative for abdominal pain, diarrhea, nausea and vomiting.  Neurological: Positive for dizziness. Negative for weakness and numbness.  All other systems reviewed and are negative.    Physical Exam Updated Vital Signs BP (!) 164/99 (BP Location: Right Arm)   Pulse (!) 50   Temp 97.8 F (36.6 C)   Resp 12   Ht 5\' 9"  (1.753 m)   Wt 165 lb (74.8 kg)   SpO2 100%   BMI 24.37 kg/m   Physical Exam  Constitutional: He is oriented to person, place, and time. He appears well-developed and well-nourished. No distress.  HENT:  Head: Normocephalic and atraumatic.  Right Ear: External ear normal.  Left Ear: External ear normal.  Nose: Nose normal.  Eyes: Pupils are equal, round, and reactive to light. Right eye exhibits no discharge. Left eye exhibits no discharge.  Right EOM normal, no obvious nystagmus. Left eye peristently deviated laterally, chronic for him  Neck: Normal range of motion. Neck supple.  Cardiovascular: Normal rate, regular rhythm and normal heart sounds.   Pulmonary/Chest: Effort normal and breath sounds normal.  Abdominal: Soft. There is no tenderness.  Musculoskeletal: He exhibits no edema.  Neurological: He is alert and oriented to person, place, and time.  CN 3-12 grossly intact. 5/5 strength in all 4 extremities. Grossly  normal sensation. Normal finger to nose.   Skin: Skin is warm and dry. He is not diaphoretic.  Nursing note and vitals reviewed.    ED Treatments / Results  Labs (all labs ordered are listed, but only abnormal results are displayed) Labs Reviewed  BASIC METABOLIC PANEL - Abnormal; Notable for the following:       Result Value   Glucose, Bld 155 (*)    All other components within normal limits  URINALYSIS, ROUTINE W REFLEX MICROSCOPIC - Abnormal; Notable for the following:    Glucose, UA 150 (*)    Hgb urine dipstick SMALL (*)    Squamous Epithelial / LPF 0-5 (*)    All other components within normal limits  CBC  PROTIME-INR  APTT  RAPID URINE DRUG SCREEN, HOSP PERFORMED  I-STAT TROPOININ, ED    EKG  EKG Interpretation  Date/Time:  Thursday Mar 08 2017 16:33:57 EDT Ventricular Rate:  59 PR Interval:    QRS Duration: 152 QT Interval:  468 QTC Calculation: 464  R Axis:   64 Text Interpretation:  Atrial fibrillation Ventricular premature complex Right bundle branch block besides PVC, no new changes compared to Mar 2011 Confirmed by Criss Alvine MD, Bosco Paparella [phone]) on 03/08/2017 5:27:51 PM       Radiology Ct Angio Head W Or Wo Contrast  Result Date: 03/08/2017 CLINICAL DATA:  83 y/o M; vertigo, rule out posterior circulation CVA. EXAM: CT ANGIOGRAPHY HEAD AND NECK TECHNIQUE: Multidetector CT imaging of the head and neck was performed using the standard protocol during bolus administration of intravenous contrast. Multiplanar CT image reconstructions and MIPs were obtained to evaluate the vascular anatomy. Carotid stenosis measurements (when applicable) are obtained utilizing NASCET criteria, using the distal internal carotid diameter as the denominator. CONTRAST:  75 cc Isovue 370 COMPARISON:  None. FINDINGS: CT HEAD FINDINGS Brain: No evidence of acute infarction, hemorrhage, hydrocephalus, extra-axial collection or mass lesion/mass effect. Nonspecific foci of hypoattenuation in  subcortical and periventricular white matter are compatible with moderate chronic microvascular ischemic changes. Moderate brain parenchymal volume loss. Vascular: As below. Skull: Normal. Negative for fracture or focal lesion. Sinuses: Trace right mastoid effusion. Mucosal thickening of the ethmoid air cells. Maxillary sinus mucous retention cyst. Orbits: The orbits are unremarkable. Review of the MIP images confirms the above findings CTA NECK FINDINGS Aortic arch: Standard branching. Imaged portion shows no evidence of aneurysm or dissection. No significant stenosis of the major arch vessel origins. Mild calcific atherosclerosis. No Right carotid system: No evidence of dissection, stenosis (50% or greater) or occlusion. Moderate calcified plaque of the carotid bifurcation with mild 40% stenosis of the common carotid artery just proximal to the bifurcation. Left carotid system: No evidence of dissection, stenosis (50% or greater) or occlusion. Moderate calcification of the carotid bifurcation with mild 30% stenosis of the proximal ICA. Vertebral arteries: Left dominant. Calcified plaque of left vertebral artery origin with moderate 50-60% stenosis. Otherwise no evidence of dissection, stenosis (50% or greater) or occlusion. Skeleton: With moderate multilevel cervical spondylosis. No high-grade bony canal stenosis. No Other neck: Left thyroid nodule measuring up to 8 mm and scattered small calcifications in the left lobe of the thyroid. Upper chest: Punctate right upper lobe calcified granuloma. Review of the MIP images confirms the above findings CTA HEAD FINDINGS Anterior circulation: No significant stenosis, proximal occlusion, aneurysm, or vascular malformation. No extensive calcification of the right greater than left paraclinoid internal carotid arteries with mild stenosis. Posterior circulation: Long segment of severe stenosis of the left P2 segment. Otherwise no significant stenosis, proximal occlusion,  aneurysm, or vascular malformation. The dominant left vertebral artery is extensively calcified without significant stenosis. Venous sinuses: As permitted by contrast timing, patent. Anatomic variants: Large right A1 segment, large anterior communicating artery, and small left A1 segment, normal variant. No posterior communicating artery identified. Delayed phase: No abnormal intracranial enhancement. Review of the MIP images confirms the above findings IMPRESSION: CT head: 1. No acute intracranial abnormality identified. 2. Moderate chronic microvascular ischemic changes and moderate parenchymal volume loss of the brain. 3. Mild paranasal sinus disease. CTA neck: 1. Carotid and vertebral arteries are patent. 2. Moderate calcified plaque of the carotid bifurcations with mild less than 50% stenosis of the right distal common carotid artery and proximal left internal carotid artery. 3. Calcified plaque of dominant left vertebral artery origin with moderate 50-60% stenosis. CTA head: 1. Patent circle of Willis. No large vessel occlusion, aneurysm, or vascular malformation. 2. Extensive calcific atherosclerosis of bilateral paraclinoid internal carotid arteries and left vertebral artery without  significant stenosis. 3. Long segment of severe stenosis of left P2 segment. Electronically Signed   By: Mitzi Hansen M.D.   On: 03/08/2017 20:01   Dg Eye Foreign Body  Result Date: 03/08/2017 CLINICAL DATA:  Metal working/exposure; clearance prior to MRI. EXAM: ORBITS FOR FOREIGN BODY - 2 VIEW COMPARISON:  None. FINDINGS: 2 mm metallic foreign body projects within the RIGHT infraorbital soft tissues. IMPRESSION: Metallic foreign body projecting within the RIGHT infraorbital soft tissues, MRI contraindicated. Electronically Signed   By: Awilda Metro M.D.   On: 03/08/2017 18:26   Ct Angio Neck W And/or Wo Contrast  Result Date: 03/08/2017 CLINICAL DATA:  83 y/o M; vertigo, rule out posterior circulation CVA.  EXAM: CT ANGIOGRAPHY HEAD AND NECK TECHNIQUE: Multidetector CT imaging of the head and neck was performed using the standard protocol during bolus administration of intravenous contrast. Multiplanar CT image reconstructions and MIPs were obtained to evaluate the vascular anatomy. Carotid stenosis measurements (when applicable) are obtained utilizing NASCET criteria, using the distal internal carotid diameter as the denominator. CONTRAST:  75 cc Isovue 370 COMPARISON:  None. FINDINGS: CT HEAD FINDINGS Brain: No evidence of acute infarction, hemorrhage, hydrocephalus, extra-axial collection or mass lesion/mass effect. Nonspecific foci of hypoattenuation in subcortical and periventricular white matter are compatible with moderate chronic microvascular ischemic changes. Moderate brain parenchymal volume loss. Vascular: As below. Skull: Normal. Negative for fracture or focal lesion. Sinuses: Trace right mastoid effusion. Mucosal thickening of the ethmoid air cells. Maxillary sinus mucous retention cyst. Orbits: The orbits are unremarkable. Review of the MIP images confirms the above findings CTA NECK FINDINGS Aortic arch: Standard branching. Imaged portion shows no evidence of aneurysm or dissection. No significant stenosis of the major arch vessel origins. Mild calcific atherosclerosis. No Right carotid system: No evidence of dissection, stenosis (50% or greater) or occlusion. Moderate calcified plaque of the carotid bifurcation with mild 40% stenosis of the common carotid artery just proximal to the bifurcation. Left carotid system: No evidence of dissection, stenosis (50% or greater) or occlusion. Moderate calcification of the carotid bifurcation with mild 30% stenosis of the proximal ICA. Vertebral arteries: Left dominant. Calcified plaque of left vertebral artery origin with moderate 50-60% stenosis. Otherwise no evidence of dissection, stenosis (50% or greater) or occlusion. Skeleton: With moderate multilevel  cervical spondylosis. No high-grade bony canal stenosis. No Other neck: Left thyroid nodule measuring up to 8 mm and scattered small calcifications in the left lobe of the thyroid. Upper chest: Punctate right upper lobe calcified granuloma. Review of the MIP images confirms the above findings CTA HEAD FINDINGS Anterior circulation: No significant stenosis, proximal occlusion, aneurysm, or vascular malformation. No extensive calcification of the right greater than left paraclinoid internal carotid arteries with mild stenosis. Posterior circulation: Long segment of severe stenosis of the left P2 segment. Otherwise no significant stenosis, proximal occlusion, aneurysm, or vascular malformation. The dominant left vertebral artery is extensively calcified without significant stenosis. Venous sinuses: As permitted by contrast timing, patent. Anatomic variants: Large right A1 segment, large anterior communicating artery, and small left A1 segment, normal variant. No posterior communicating artery identified. Delayed phase: No abnormal intracranial enhancement. Review of the MIP images confirms the above findings IMPRESSION: CT head: 1. No acute intracranial abnormality identified. 2. Moderate chronic microvascular ischemic changes and moderate parenchymal volume loss of the brain. 3. Mild paranasal sinus disease. CTA neck: 1. Carotid and vertebral arteries are patent. 2. Moderate calcified plaque of the carotid bifurcations with mild less than 50% stenosis of  the right distal common carotid artery and proximal left internal carotid artery. 3. Calcified plaque of dominant left vertebral artery origin with moderate 50-60% stenosis. CTA head: 1. Patent circle of Willis. No large vessel occlusion, aneurysm, or vascular malformation. 2. Extensive calcific atherosclerosis of bilateral paraclinoid internal carotid arteries and left vertebral artery without significant stenosis. 3. Long segment of severe stenosis of left P2  segment. Electronically Signed   By: Mitzi Hansen M.D.   On: 03/08/2017 20:01    Procedures Procedures (including critical care time)  Medications Ordered in ED Medications  iopamidol (ISOVUE-370) 76 % injection 75 mL (75 mLs Intravenous Contrast Given 03/08/17 1924)     Initial Impression / Assessment and Plan / ED Course  I have reviewed the triage vital signs and the nursing notes.  Pertinent labs & imaging results that were available during my care of the patient were reviewed by me and considered in my medical decision making (see chart for details).     Patient is unable to get MRI, unbeknownst to him he has some metal in the skin around his eye. CTA obtained, shows no acute occlusion. No obvious acute cause. I discussed the CT results with Dr. Gerilyn Pilgrim given the 'severe stenosis' of P2, but given already on ASA, plavix, no indication for more treatment. He is currently feeling well. Will try low dose antivert at home, f/u with PCP and neuro. Discussed return precautions  Final Clinical Impressions(s) / ED Diagnoses   Final diagnoses:  Vertigo    New Prescriptions Discharge Medication List as of 03/08/2017  8:33 PM    START taking these medications   Details  meclizine (ANTIVERT) 12.5 MG tablet Take 1 tablet (12.5 mg total) by mouth 3 (three) times daily as needed for dizziness., Starting Thu 03/08/2017, Print         Pricilla Loveless, MD 03/08/17 2130

## 2017-03-08 NOTE — ED Triage Notes (Signed)
Pt comes in for dizziness that started upon waking up. Pt went to bed normal last night at 2300. He denies any unilateral weakness. Pt went to urgent care for this dizziness and was told to come here after he had a EKG done that showed an arrhythmia. Pt is alert and oriented at this time.

## 2017-03-19 ENCOUNTER — Ambulatory Visit (INDEPENDENT_AMBULATORY_CARE_PROVIDER_SITE_OTHER): Payer: Medicare Other | Admitting: Family Medicine

## 2017-03-19 ENCOUNTER — Encounter: Payer: Self-pay | Admitting: Family Medicine

## 2017-03-19 VITALS — BP 121/77 | HR 48 | Temp 97.5°F | Ht 69.0 in | Wt 169.0 lb

## 2017-03-19 DIAGNOSIS — R42 Dizziness and giddiness: Secondary | ICD-10-CM | POA: Diagnosis not present

## 2017-03-19 DIAGNOSIS — R7309 Other abnormal glucose: Secondary | ICD-10-CM | POA: Diagnosis not present

## 2017-03-19 DIAGNOSIS — E119 Type 2 diabetes mellitus without complications: Secondary | ICD-10-CM | POA: Diagnosis not present

## 2017-03-19 LAB — URINALYSIS
BILIRUBIN UA: NEGATIVE
Glucose, UA: NEGATIVE
KETONES UA: NEGATIVE
Leukocytes, UA: NEGATIVE
NITRITE UA: POSITIVE — AB
PH UA: 5 (ref 5.0–7.5)
Protein, UA: NEGATIVE
SPEC GRAV UA: 1.025 (ref 1.005–1.030)
UUROB: 0.2 mg/dL (ref 0.2–1.0)

## 2017-03-19 LAB — BAYER DCA HB A1C WAIVED: HB A1C: 7.7 % — AB (ref <7.0)

## 2017-03-19 MED ORDER — METFORMIN HCL ER 500 MG PO TB24: 500.0000 mg | ORAL_TABLET | Freq: Every day | ORAL | 2 refills | Status: DC

## 2017-03-19 NOTE — Progress Notes (Signed)
Subjective:  Patient ID: Patrick Potts, male    DOB: 05-28-1925  Age: 83 y.o. MRN: [id number]  CC: New Patient (Initial Visit) (pt here today to establish care, he has been seeing a Cardiologist for the past 10 years but he has had some recent episodes of vertigo and was told to get a PCP.)   HPI Patrick Potts presents for intermittent vertigo for 3 mos. Noticed on first arising. Can last all day. Not every day. Denies chest pain accompanying. No syncope. Went to E.D. A few days ago. Given ativan, seems to help some. No recent changes in meds. No recent TIA-like symptoms elicited on discussion. (Numb, weak focally, change of LOC.) Med history review. Wife gives hx that pt. Has a blockage behind the heart, but heart doctor says it is too dangerous to stent it.(Dr. Irish Lack.) Denies change in hearing (nmuffled with the dizziness, no hearing loss recently, although slow decline over years noted.)  History Patrick Potts has a past medical history of ASCVD (arteriosclerotic cardiovascular disease); Atrial fibrillation ([place]) (04/28/2014); Back pain; Essential hypertension, benign (04/28/2014); Myalgia; Myocardial infarction ([place]); and PAF (paroxysmal atrial fibrillation) ([place]).   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   His family history includes Arrhythmia in his mother; Breast cancer in his mother; Healthy in his father.He reports that he has never smoked. He has never used smokeless tobacco. He reports that he does not drink alcohol or use drugs.  Current Outpatient Prescriptions on File Prior to Visit  Medication Sig Dispense Refill  . amLODipine (NORVASC) 10 MG tablet Take 1 Tablet by mouth once daily 90 tablet 3  . aspirin 81 MG tablet Take 81 mg by mouth daily.    Marland Kitchen atorvastatin (LIPITOR) 40 MG tablet TAKE 1 TABLET ONCE DAILY AT 6PM 30 tablet 9  . clopidogrel (PLAVIX) 75 MG tablet Take 1  tablet (75 mg total) by mouth daily. 30 tablet 11  . irbesartan-hydrochlorothiazide (AVALIDE) 300-12.5 MG tablet Take 1 tablet by mouth daily. 30 tablet 10  . meclizine (ANTIVERT) 12.5 MG tablet Take 1 tablet (12.5 mg total) by mouth 3 (three) times daily as needed for dizziness. 10 tablet 0  . metoprolol (LOPRESSOR) 50 MG tablet TAKE  (1)  TABLET TWICE A DAY. 60 tablet 11  . NITROSTAT 0.4 MG SL tablet DISSOLVE 1 TABLET UNDER TONGUE REPEAT EVERY 5 MINUTES UP TO 3 TIMES AS DIRECTED FOR CHEST PAIN 25 tablet 4  . potassium chloride (K-DUR) 10 MEQ tablet Take 1 tablet (10 mEq total) by mouth daily. 90 tablet 1   No current facility-administered medications on file prior to visit.     ROS Review of Systems  Constitutional: Negative for chills, diaphoresis, fever and unexpected weight change.  HENT: Positive for hearing loss. Negative for congestion, ear pain, rhinorrhea and sore throat.   Eyes: Negative for visual disturbance.  Respiratory: Negative for cough and shortness of breath.   Cardiovascular: Negative for chest pain.  Gastrointestinal: Negative for abdominal pain, constipation and diarrhea.  Genitourinary: Negative for dysuria and flank pain.  Musculoskeletal: Negative for arthralgias and joint swelling.  Skin: Negative for rash.  Neurological: Positive for dizziness and light-headedness. Negative for tremors, seizures, syncope, speech difficulty, numbness and headaches.  Psychiatric/Behavioral: Negative for dysphoric mood and sleep disturbance.    Objective:  BP 121/77   Pulse (!) 48   Temp 97.5 F (36.4 C) (Oral)   Ht '5\' 9"'$  (1.753 m)  Wt 169 lb (76.7 kg)   BMI 24.96 kg/m   Physical Exam  Constitutional: He is oriented to person, place, and time. He appears well-developed and well-nourished. No distress.  HENT:  Head: Normocephalic and atraumatic.  Right Ear: External ear normal.  Left Ear: External ear normal.  Nose: Nose normal.  Mouth/Throat: Oropharynx is clear and  moist.  Large cerumen impaction removed from left EAC. Smaller amount AD  Eyes: Conjunctivae and EOM are normal. Pupils are equal, round, and reactive to light.  Neck: Normal range of motion. Neck supple. No thyromegaly present.  Cardiovascular: Normal rate, regular rhythm and normal heart sounds.   No murmur heard. Pulmonary/Chest: Effort normal and breath sounds normal. No respiratory distress. He has no wheezes. He has no rales.  Abdominal: Soft. Bowel sounds are normal. He exhibits no distension. There is no tenderness.  Lymphadenopathy:    He has no cervical adenopathy.  Neurological: He is alert and oriented to person, place, and time. He has normal reflexes.  Skin: Skin is warm and dry.  Psychiatric: He has a normal mood and affect. His behavior is normal. Judgment and thought content normal.   Hb A1c=7.7 Assessment & Plan:   Patrick Potts was seen today for new patient (initial visit).  Diagnoses and all orders for this visit:  Vertigo -     CMP14+EGFR -     CBC with Differential/Platelet  Elevated glucose -     Bayer DCA Hb A1c Waived -     CMP14+EGFR -     CBC with Differential/Platelet  Diabetes mellitus without complication (HCC) -     Microalbumin / creatinine urine ratio -     Urinalysis -     Lipid panel -     CMP14+EGFR -     CBC with Differential/Platelet  Other orders -     metFORMIN (GLUCOPHAGE-XR) 500 MG 24 hr tablet; Take 1 tablet (500 mg total) by mouth daily with breakfast.   I am having Mr. Goyer start on metFORMIN. I am also having him maintain his aspirin, NITROSTAT, potassium chloride, metoprolol tartrate, clopidogrel, irbesartan-hydrochlorothiazide, atorvastatin, amLODipine, and meclizine.  Meds ordered this encounter  Medications  . metFORMIN (GLUCOPHAGE-XR) 500 MG 24 hr tablet    Sig: Take 1 tablet (500 mg total) by mouth daily with breakfast.    Dispense:  30 tablet    Refill:  2   Patient has had no previous history of diabetes. His wife  tells me today that he has had a couple of elevated blood glucose levels in the past but it's never been checked further.  Patient was instructed in carb-controlled diet. He should start metformin. We'll monitor for resolution of his vertigo. I advised him against taking the controlled substance Ativan for the time being. For now  Follow-up: Return in about 1 month (around 04/19/2017).  Claretta Fraise, M.D.

## 2017-03-20 LAB — CMP14+EGFR
ALT: 19 IU/L (ref 0–44)
AST: 21 IU/L (ref 0–40)
Albumin/Globulin Ratio: 1.8 (ref 1.2–2.2)
Albumin: 4.3 g/dL (ref 3.5–4.7)
Alkaline Phosphatase: 70 IU/L (ref 39–117)
BILIRUBIN TOTAL: 2.3 mg/dL — AB (ref 0.0–1.2)
BUN/Creatinine Ratio: 18 (ref 10–24)
BUN: 21 mg/dL (ref 8–27)
CHLORIDE: 100 mmol/L (ref 96–106)
CO2: 25 mmol/L (ref 18–29)
Calcium: 9.4 mg/dL (ref 8.6–10.2)
Creatinine, Ser: 1.16 mg/dL (ref 0.76–1.27)
GFR, EST AFRICAN AMERICAN: 64 mL/min/{1.73_m2} (ref >59)
GFR, EST NON AFRICAN AMERICAN: 56 mL/min/{1.73_m2} — AB (ref >59)
GLUCOSE: 143 mg/dL — AB (ref 65–99)
Globulin, Total: 2.4 g/dL (ref 1.5–4.5)
POTASSIUM: 4.1 mmol/L (ref 3.5–5.2)
Sodium: 141 mmol/L (ref 134–144)
Total Protein: 6.7 g/dL (ref 6.0–8.5)

## 2017-03-20 LAB — CBC WITH DIFFERENTIAL/PLATELET
BASOS ABS: 0 10*3/uL (ref 0.0–0.2)
Basos: 0 %
EOS (ABSOLUTE): 0.3 10*3/uL (ref 0.0–0.4)
Eos: 4 %
HEMOGLOBIN: 13.7 g/dL (ref 13.0–17.7)
Hematocrit: 39.8 % (ref 37.5–51.0)
IMMATURE GRANS (ABS): 0 10*3/uL (ref 0.0–0.1)
IMMATURE GRANULOCYTES: 1 %
LYMPHS: 20 %
Lymphocytes Absolute: 1.6 10*3/uL (ref 0.7–3.1)
MCH: 31.2 pg (ref 26.6–33.0)
MCHC: 34.4 g/dL (ref 31.5–35.7)
MCV: 91 fL (ref 79–97)
MONOCYTES: 10 %
Monocytes Absolute: 0.8 10*3/uL (ref 0.1–0.9)
NEUTROS ABS: 5.5 10*3/uL (ref 1.4–7.0)
NEUTROS PCT: 65 %
PLATELETS: 174 10*3/uL (ref 150–379)
RBC: 4.39 x10E6/uL (ref 4.14–5.80)
RDW: 13.4 % (ref 12.3–15.4)
WBC: 8.3 10*3/uL (ref 3.4–10.8)

## 2017-03-20 LAB — LIPID PANEL
CHOL/HDL RATIO: 2.9 ratio (ref 0.0–5.0)
Cholesterol, Total: 146 mg/dL (ref 100–199)
HDL: 50 mg/dL (ref >39)
LDL CALC: 77 mg/dL (ref 0–99)
Triglycerides: 94 mg/dL (ref 0–149)
VLDL CHOLESTEROL CAL: 19 mg/dL (ref 5–40)

## 2017-03-20 LAB — MICROALBUMIN / CREATININE URINE RATIO
Creatinine, Urine: 190.4 mg/dL
Microalb/Creat Ratio: 3.9 mg/g creat (ref 0.0–30.0)
Microalbumin, Urine: 7.5 ug/mL

## 2017-04-17 ENCOUNTER — Encounter: Payer: Self-pay | Admitting: Family Medicine

## 2017-04-17 ENCOUNTER — Ambulatory Visit (INDEPENDENT_AMBULATORY_CARE_PROVIDER_SITE_OTHER): Payer: Medicare Other | Admitting: Family Medicine

## 2017-04-17 VITALS — BP 120/68 | HR 70 | Temp 97.0°F | Ht 69.0 in | Wt 167.0 lb

## 2017-04-17 DIAGNOSIS — R42 Dizziness and giddiness: Secondary | ICD-10-CM | POA: Diagnosis not present

## 2017-04-17 NOTE — Progress Notes (Signed)
Subjective:  Patient ID: Patrick Potts, male    DOB: 12-May-1925  Age: 83 y.o. MRN: [id number]  CC: Follow-up (pt here today following up after having dizzy spells. We performed an ear lavage last visit and pt states he hasn't had any dizzy spells since then.)   HPI Blaine Judie Grieve presents for recheck of ears. Denies pain, decreased hearing and dizziness. No vertigo since one day after last office eval.   History Ranvir has a past medical history of ASCVD (arteriosclerotic cardiovascular disease); Atrial fibrillation (HCC) (04/28/2014); Back pain; Essential hypertension, benign (04/28/2014); Myalgia; Myocardial infarction (HCC); and PAF (paroxysmal atrial fibrillation) (HCC).   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   His family history includes Arrhythmia in his mother; Breast cancer in his mother; Healthy in his father.He reports that he has never smoked. He has never used smokeless tobacco. He reports that he does not drink alcohol or use drugs.    ROS Review of Systems Noncontributory Objective:  BP 120/68   Pulse 70   Temp 97 F (36.1 C) (Oral)   Ht 5\' 9"  (1.753 m)   Wt 167 lb (75.8 kg)   BMI 24.66 kg/m   BP Readings from Last 3 Encounters:  04/17/17 120/68  03/19/17 121/77  03/08/17 (!) 164/99    Wt Readings from Last 3 Encounters:  04/17/17 167 lb (75.8 kg)  03/19/17 169 lb (76.7 kg)  03/08/17 165 lb (74.8 kg)     Physical Exam  Constitutional: He appears well-developed and well-nourished. No distress.  HENT:  Right Ear: Hearing and tympanic membrane normal.  Left Ear: Hearing and tympanic membrane normal.  Cardiovascular: Normal rate and normal heart sounds.   No murmur heard. Pulmonary/Chest: Effort normal and breath sounds normal.  Musculoskeletal: Normal range of motion.  Psychiatric: He has a normal mood and affect. His behavior is normal.        Assessment & Plan:   Oreste was seen today for follow-up.  Diagnoses and all orders for this visit:  Vertigo       I am having Mr. Pecore maintain his aspirin, NITROSTAT, potassium chloride, metoprolol tartrate, clopidogrel, irbesartan-hydrochlorothiazide, atorvastatin, amLODipine, meclizine, and metFORMIN.  Allergies as of 04/17/2017   No Known Allergies     Medication List       Accurate as of 04/17/17  9:44 PM. Always use your most recent med list.          amLODipine 10 MG tablet Commonly known as:  NORVASC Take 1 Tablet by mouth once daily   aspirin 81 MG tablet Take 81 mg by mouth daily.   atorvastatin 40 MG tablet Commonly known as:  LIPITOR TAKE 1 TABLET ONCE DAILY AT 6PM   clopidogrel 75 MG tablet Commonly known as:  PLAVIX Take 1 tablet (75 mg total) by mouth daily.   irbesartan-hydrochlorothiazide 300-12.5 MG tablet Commonly known as:  AVALIDE Take 1 tablet by mouth daily.   meclizine 12.5 MG tablet Commonly known as:  ANTIVERT Take 1 tablet (12.5 mg total) by mouth 3 (three) times daily as needed for dizziness.   metFORMIN 500 MG 24 hr tablet Commonly known as:  GLUCOPHAGE-XR Take 1 tablet (500 mg total) by mouth daily with breakfast.   metoprolol tartrate 50 MG tablet Commonly known as:  LOPRESSOR TAKE  (1)  TABLET TWICE A DAY.   NITROSTAT 0.4 MG SL tablet Generic drug:  nitroGLYCERIN DISSOLVE 1 TABLET UNDER  TONGUE REPEAT EVERY 5 MINUTES UP TO 3 TIMES AS DIRECTED FOR CHEST PAIN   potassium chloride 10 MEQ tablet Commonly known as:  K-DUR Take 1 tablet (10 mEq total) by mouth daily.        Follow-up: Return if symptoms worsen or fail to improve.  Mechele ClaudeWarren Tilley Faeth, M.D.

## 2017-04-26 ENCOUNTER — Telehealth: Payer: Self-pay | Admitting: Family Medicine

## 2017-04-26 NOTE — Telephone Encounter (Signed)
Covering for PCP  Pt likely needs to be seen again.   Murtis SinkSam Bradshaw, MD [facility] 04/26/2017, 10:31 AM  '

## 2017-04-26 NOTE — Telephone Encounter (Signed)
Patient of Dr Darlyn ReadStacks. Was seen on 04-17-17 and Dr Darlyn ReadStacks cleaned out ears and felt that his vertigo would improve after that. Grand daughter states that he is very dizzy and nauseated still and she is concerned. Wants to know what to do next. [facility]

## 2017-04-26 NOTE — Telephone Encounter (Signed)
Spoke with Victorino DikeJennifer and she states pt was very dizzy and nauseated and wanted to bring him in to be seen again. Pt given appt tomorrow with Dr Darlyn ReadStacks at 9:10.

## 2017-04-27 ENCOUNTER — Ambulatory Visit (INDEPENDENT_AMBULATORY_CARE_PROVIDER_SITE_OTHER): Payer: Medicare Other | Admitting: Family Medicine

## 2017-04-27 ENCOUNTER — Encounter: Payer: Self-pay | Admitting: Family Medicine

## 2017-04-27 VITALS — BP 126/75 | HR 61 | Temp 96.9°F | Ht 69.0 in | Wt 165.0 lb

## 2017-04-27 DIAGNOSIS — R42 Dizziness and giddiness: Secondary | ICD-10-CM | POA: Diagnosis not present

## 2017-04-27 DIAGNOSIS — E119 Type 2 diabetes mellitus without complications: Secondary | ICD-10-CM | POA: Diagnosis not present

## 2017-04-27 DIAGNOSIS — I482 Chronic atrial fibrillation, unspecified: Secondary | ICD-10-CM

## 2017-04-27 DIAGNOSIS — I10 Essential (primary) hypertension: Secondary | ICD-10-CM

## 2017-04-27 LAB — GLUCOSE HEMOCUE WAIVED: GLU HEMOCUE WAIVED: 145 mg/dL — AB (ref 65–99)

## 2017-04-27 MED ORDER — IRBESARTAN 300 MG PO TABS: 300.0000 mg | ORAL_TABLET | Freq: Every day | ORAL | 5 refills | Status: DC

## 2017-04-27 MED ORDER — BLOOD GLUCOSE MONITOR KIT: PACK | 0 refills | Status: DC

## 2017-04-27 NOTE — Progress Notes (Signed)
Subjective:  Patient ID: Patrick Potts, male    DOB: [birth year]  Age: 83 y.o. MRN: [id number]  CC: Dizziness (pt here today after having episodes of dizzy spells and vomiting but is better this morning. Patrick Potts (his grand-daughter) is with him stating his BS was 159 at 1pm yesterday and she is concerned about that.)   HPI Patrick Potts presents for Standing of feeling lightheaded and the like she might fall. This occurs when he first gets out of bed or when he's been seeing him for a while. It has been accompanied by nausea at times. It is complicated by his diabetes. His blood sugar was 154 yesterday at 1 PM. He had eaten a lot of sweets the night before. He continues to take medication for his blood pressure. He does not drink water. All the local he is off balance there is no room spinning or vertigo equivalent.   History Patrick Potts has a past medical history of ASCVD (arteriosclerotic cardiovascular disease); Atrial fibrillation ([place]) (04/28/2014); Back pain; Essential hypertension, benign (04/28/2014); Myalgia; Myocardial infarction ([place]); and PAF (paroxysmal atrial fibrillation) ([place]).   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   His family history includes Arrhythmia in his mother; Breast cancer in his mother; Healthy in his father.He reports that he has never smoked. He has never used smokeless tobacco. He reports that he does not drink alcohol or use drugs.    ROS Review of Systems  Constitutional: Negative for chills, diaphoresis, fever and unexpected weight change.  HENT: Negative for congestion, hearing loss, rhinorrhea and sore throat.   Eyes: Negative for visual disturbance.  Respiratory: Negative for cough and shortness of breath.   Cardiovascular: Negative for chest pain.  Gastrointestinal: Negative for abdominal pain, constipation and diarrhea.  Genitourinary:  Negative for dysuria and flank pain.  Musculoskeletal: Negative for arthralgias and joint swelling.  Skin: Negative for rash.  Neurological: Positive for dizziness and light-headedness. Negative for headaches.  Psychiatric/Behavioral: Negative for dysphoric mood and sleep disturbance.    Objective:  BP 126/75   Pulse 61   Temp (!) 96.9 F (36.1 C) (Oral)   Ht _0  (1.753 m)   Wt 165 lb (74.8 kg)   BMI 24.37 kg/m   BP Readings from Last 3 Encounters:  04/27/17 126/75  04/17/17 120/68  03/19/17 121/77    Wt Readings from Last 3 Encounters:  04/27/17 165 lb (74.8 kg)  04/17/17 167 lb (75.8 kg)  03/19/17 169 lb (76.7 kg)     Physical Exam  Constitutional: He is oriented to person, place, and time. He appears well-developed and well-nourished. No distress.  HENT:  Head: Normocephalic and atraumatic.  Right Ear: External ear normal.  Left Ear: External ear normal.  Nose: Nose normal.  Mouth/Throat: Oropharynx is clear and moist.  Eyes: Conjunctivae and EOM are normal. Pupils are equal, round, and reactive to light.  Neck: Normal range of motion. Neck supple. No thyromegaly present.  Cardiovascular: Normal rate, regular rhythm and normal heart sounds.   No murmur heard. Pulmonary/Chest: Effort normal and breath sounds normal. No respiratory distress. He has no wheezes. He has no rales.  Abdominal: Soft. Bowel sounds are normal. He exhibits no distension. There is no tenderness.  Lymphadenopathy:    He has no cervical adenopathy.  Neurological: He is alert and oriented to person, place, and time. He has normal reflexes.  Skin: Skin is warm and dry.  Psychiatric:  He has a normal mood and affect. His behavior is normal. Judgment and thought content normal.      Assessment & Plan:   Patrick Potts was seen today for dizziness.  Diagnoses and all orders for this visit:  Dizzy -     Glucose Hemocue Waived  Chronic atrial fibrillation ([place])  Essential hypertension,  benign  Type 2 diabetes mellitus without complication, without long-term current use of insulin (HCC)  Other orders -     irbesartan (AVAPRO) 300 MG tablet; Take 1 tablet (300 mg total) by mouth daily. -     blood glucose meter kit and supplies KIT; Dispense based on patient and insurance preference. Use up to four times daily as directed. (FOR ICD-9 250.00, 250.01).   Check glucose before eating in the morning and again two hours after supper. Write down the results on the glucose log sheet. Bring the log with you to the next appointment  I have discontinued Mr. Sensabaugh irbesartan-hydrochlorothiazide. I am also having him start on irbesartan and blood glucose meter kit and supplies. Additionally, I am having him maintain his aspirin, NITROSTAT, potassium chloride, metoprolol tartrate, clopidogrel, atorvastatin, amLODipine, meclizine, and metFORMIN.  Allergies as of 04/27/2017   No Known Allergies     Medication List       Accurate as of 04/27/17  2:23 PM. Always use your most recent med list.          amLODipine 10 MG tablet Commonly known as:  NORVASC Take 1 Tablet by mouth once daily   aspirin 81 MG tablet Take 81 mg by mouth daily.   atorvastatin 40 MG tablet Commonly known as:  LIPITOR TAKE 1 TABLET ONCE DAILY AT 6PM   blood glucose meter kit and supplies Kit Dispense based on patient and insurance preference. Use up to four times daily as directed. (FOR ICD-9 250.00, 250.01).   clopidogrel 75 MG tablet Commonly known as:  PLAVIX Take 1 tablet (75 mg total) by mouth daily.   irbesartan 300 MG tablet Commonly known as:  AVAPRO Take 1 tablet (300 mg total) by mouth daily.   meclizine 12.5 MG tablet Commonly known as:  ANTIVERT Take 1 tablet (12.5 mg total) by mouth 3 (three) times daily as needed for dizziness.   metFORMIN 500 MG 24 hr tablet Commonly known as:  GLUCOPHAGE-XR Take 1 tablet (500 mg total) by mouth daily with breakfast.   metoprolol tartrate 50  MG tablet Commonly known as:  LOPRESSOR TAKE  (1)  TABLET TWICE A DAY.   NITROSTAT 0.4 MG SL tablet Generic drug:  nitroGLYCERIN DISSOLVE 1 TABLET UNDER TONGUE REPEAT EVERY 5 MINUTES UP TO 3 TIMES AS DIRECTED FOR CHEST PAIN   potassium chloride 10 MEQ tablet Commonly known as:  K-DUR Take 1 tablet (10 mEq total) by mouth daily.        Follow-up: Return in about 1 month (around 05/27/2017).  Claretta Fraise, M.D.

## 2017-04-27 NOTE — Addendum Note (Signed)
Addended by: Julious PayerHOLT, Tandrea Kommer D on: 04/27/2017 04:56 PM   Modules accepted: Orders

## 2017-04-30 ENCOUNTER — Other Ambulatory Visit: Payer: Self-pay | Admitting: Family Medicine

## 2017-05-28 ENCOUNTER — Encounter: Payer: Self-pay | Admitting: Family Medicine

## 2017-05-28 ENCOUNTER — Ambulatory Visit (INDEPENDENT_AMBULATORY_CARE_PROVIDER_SITE_OTHER): Payer: Medicare Other | Admitting: Family Medicine

## 2017-05-28 VITALS — BP 138/74 | HR 79 | Temp 97.0°F | Ht 69.0 in | Wt 163.5 lb

## 2017-05-28 DIAGNOSIS — I482 Chronic atrial fibrillation, unspecified: Secondary | ICD-10-CM

## 2017-05-28 DIAGNOSIS — I10 Essential (primary) hypertension: Secondary | ICD-10-CM

## 2017-05-28 DIAGNOSIS — R42 Dizziness and giddiness: Secondary | ICD-10-CM

## 2017-05-28 DIAGNOSIS — D692 Other nonthrombocytopenic purpura: Secondary | ICD-10-CM | POA: Diagnosis not present

## 2017-05-28 DIAGNOSIS — E782 Mixed hyperlipidemia: Secondary | ICD-10-CM

## 2017-05-28 NOTE — Progress Notes (Signed)
Subjective:  Patient ID: Patrick Potts, male    DOB: 1925/01/14  Age: 83 y.o. MRN: [id number]  CC: Follow-up (pt here today for follow up on his Vertigo and he states he hasn't had an episode in a while. He is also saying that he has been having more sweets lately but his BS is ok.)   HPI Patrick Potts presents for  follow-up of hypertension. Patient has no history of headache chest pain or shortness of breath or recent cough. Patient also denies symptoms of TIA such as numbness weakness lateralizing.Patient reports knee pain went away after stopping atorvastatin.He tells me that the vertigo went away shortly after he was here. For a few days afterwards he noticed mild sensation when he rolled over in bed or turning his head rapidly at home. Patient states that he's backed off on eating extra sweets like she had mentioned last time. He has discontinued those or at least cut back History Patrick Potts has a past medical history of ASCVD (arteriosclerotic cardiovascular disease); Atrial fibrillation ([place]) (04/28/2014); Back pain; Essential hypertension, benign (04/28/2014); Myalgia; Myocardial infarction ([place]); and PAF (paroxysmal atrial fibrillation) ([place]).   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   His family history includes Arrhythmia in his mother; Breast cancer in his mother; Healthy in his father.He reports that he has never smoked. He has never used smokeless tobacco. He reports that he does not drink alcohol or use drugs.  Current Outpatient Prescriptions on File Prior to Visit  Medication Sig Dispense Refill  . amLODipine (NORVASC) 10 MG tablet Take 1 Tablet by mouth once daily 90 tablet 3  . aspirin 81 MG tablet Take 81 mg by mouth daily.    . blood glucose meter kit and supplies KIT Dispense based on patient and insurance preference. Use up to four times daily as directed. (FOR ICD-9  250.00, 250.01). 1 each 0  . clopidogrel (PLAVIX) 75 MG tablet Take 1 tablet (75 mg total) by mouth daily. 30 tablet 11  . irbesartan (AVAPRO) 300 MG tablet Take 1 tablet (300 mg total) by mouth daily. 30 tablet 5  . meclizine (ANTIVERT) 12.5 MG tablet Take 1 tablet (12.5 mg total) by mouth 3 (three) times daily as needed for dizziness. 10 tablet 0  . metFORMIN (GLUCOPHAGE-XR) 500 MG 24 hr tablet Take 1 tablet (500 mg total) by mouth daily with breakfast. 30 tablet 2  . metoprolol (LOPRESSOR) 50 MG tablet TAKE  (1)  TABLET TWICE A DAY. 60 tablet 11  . nitroGLYCERIN (NITROSTAT) 0.4 MG SL tablet DISSOLVE 1 TABLET UNDER THE TONGUE EVERY 5 MINUTES AS NEEDED FOR CHEST PAIN. DO NOT EXCEED A TOTAL OF 3 DOSES IN 15 MINUTES. 25 tablet 2  . potassium chloride (K-DUR) 10 MEQ tablet Take 1 tablet (10 mEq total) by mouth daily. 90 tablet 1   No current facility-administered medications on file prior to visit.     ROS Review of Systems  Constitutional: Negative for chills, diaphoresis and fever.  HENT: Negative for rhinorrhea and sore throat.   Respiratory: Negative for cough and shortness of breath.   Cardiovascular: Negative for chest pain.  Gastrointestinal: Negative for abdominal pain.  Musculoskeletal: Negative for arthralgias and myalgias.  Skin: Negative for rash.  Neurological: Negative for weakness and headaches.    Objective:  BP 138/74   Pulse 79   Temp (!) 97 F (36.1 C) (Oral)   Ht _0  (1.753  m)   Wt 163 lb 8 oz (74.2 kg)   BMI 24.14 kg/m   BP Readings from Last 3 Encounters:  05/28/17 138/74  04/27/17 126/75  04/17/17 120/68    Wt Readings from Last 3 Encounters:  05/28/17 163 lb 8 oz (74.2 kg)  04/27/17 165 lb (74.8 kg)  04/17/17 167 lb (75.8 kg)     Physical Exam  Constitutional: He is oriented to person, place, and time. He appears well-developed and well-nourished.  HENT:  Head: Normocephalic and atraumatic.  Right Ear: External ear normal.  Left Ear:  External ear normal.  Mouth/Throat: No oropharyngeal exudate or posterior oropharyngeal erythema.  Eyes: Pupils are equal, round, and reactive to light.  Neck: Normal range of motion. Neck supple.  Cardiovascular:  Irregular/irregular controlled rate  Pulmonary/Chest: Breath sounds normal. No respiratory distress.  Neurological: He is alert and oriented to person, place, and time.  Skin: Skin is warm and dry.  Multiple bruises on forearms  Psychiatric: He has a normal mood and affect.  Vitals reviewed.    Lab Results  Component Value Date   WBC 8.3 03/19/2017   HGB 13.7 03/19/2017   HCT 39.8 03/19/2017   PLT 174 03/19/2017   GLUCOSE 143 (H) 03/19/2017   CHOL 146 03/19/2017   TRIG 94 03/19/2017   HDL 50 03/19/2017   LDLCALC 77 03/19/2017   ALT 19 03/19/2017   AST 21 03/19/2017   NA 141 03/19/2017   K 4.1 03/19/2017   CL 100 03/19/2017   CREATININE 1.16 03/19/2017   BUN 21 03/19/2017   CO2 25 03/19/2017   INR 1.01 03/08/2017   HGBA1C (H) 01/17/2010    6.9 (NOTE) The ADA recommends the following therapeutic goal for glycemic control related to Hgb A1c measurement: Goal of therapy: <6.5 Hgb A1c  Reference: American Diabetes Association: Clinical Practice Recommendations 2010, Diabetes Care, 2010, 33: (Suppl  1).      Assessment & Plan:   Patrick Potts was seen today for follow-up.  Diagnoses and all orders for this visit:  Essential hypertension, benign  Mixed hyperlipidemia  Chronic atrial fibrillation ([place])  Vertigo  Senile purpura ([place])   I have discontinued Patrick Potts atorvastatin. I am also having him maintain his aspirin, potassium chloride, metoprolol tartrate, clopidogrel, amLODipine, meclizine, metFORMIN, blood glucose meter kit and supplies, irbesartan, and nitroGLYCERIN.   Follow-up: Return in about 6 weeks (around 07/09/2017).  Claretta Fraise, M.D.

## 2017-06-26 ENCOUNTER — Other Ambulatory Visit: Payer: Self-pay | Admitting: Family Medicine

## 2017-07-03 ENCOUNTER — Other Ambulatory Visit: Payer: Self-pay | Admitting: Interventional Cardiology

## 2017-07-11 ENCOUNTER — Ambulatory Visit (INDEPENDENT_AMBULATORY_CARE_PROVIDER_SITE_OTHER): Payer: Medicare Other | Admitting: Family Medicine

## 2017-07-11 ENCOUNTER — Encounter: Payer: Self-pay | Admitting: Family Medicine

## 2017-07-11 VITALS — BP 137/64 | HR 82 | Ht 69.0 in | Wt 160.0 lb

## 2017-07-11 DIAGNOSIS — I482 Chronic atrial fibrillation, unspecified: Secondary | ICD-10-CM

## 2017-07-11 DIAGNOSIS — I10 Essential (primary) hypertension: Secondary | ICD-10-CM | POA: Diagnosis not present

## 2017-07-11 DIAGNOSIS — E756 Lipid storage disorder, unspecified: Secondary | ICD-10-CM

## 2017-07-11 DIAGNOSIS — E1169 Type 2 diabetes mellitus with other specified complication: Secondary | ICD-10-CM

## 2017-07-11 LAB — BAYER DCA HB A1C WAIVED: HB A1C (BAYER DCA - WAIVED): 6.3 % (ref <7.0)

## 2017-07-11 MED ORDER — METFORMIN HCL ER 500 MG PO TB24: ORAL_TABLET | ORAL | 2 refills | Status: DC

## 2017-07-11 NOTE — Progress Notes (Signed)
Subjective:  Patient ID: Patrick Potts,  male    DOB: 05-31-1925  Age: 83 y.o.    CC: Follow-up (pt here today for a 6 week follow up and he hasn't had any dizzy spells lately and knees are feeling better)   HPI Patrick Potts presents for  follow-up of hypertension. Patient has no history of headache chest pain or shortness of breath or recent cough. Patient denies side effects from medication. States taking it regularly.  Patient also  in for follow-up of elevated bilirubin. No GI sx. Denies jaundice. No chnge in stool.  Follow-up of diabetes. Patient does check blood sugar at home. Wife monitors for him. He doesn't remember them. No log returned Patient denies symptoms such as polyuria, polydipsia, excessive hunger, nausea No significant hypoglycemic spells noted. Medications reviewed. Pt reports taking them regularly. Pt. denies complication/adverse reaction today.    Patient in for follow-up of atrial fibrillation. Patient denies any recent bouts of chest pain or palpitations. Additionally, patient is taking plavix for anticoagulation.  Patient denies any recent excessive bleeding episodes including epistaxis, bleeding from the gums, genitalia, rectal bleeding or hematuria. Additionally there has been no excessive bruising.However he does have purpuric eruptions on the forearms bilaterally.  History Patrick Potts has a past medical history of ASCVD (arteriosclerotic cardiovascular disease); Atrial fibrillation ([place]) (04/28/2014); Back pain; Essential hypertension, benign (04/28/2014); Myalgia; Myocardial infarction ([place]); and PAF (paroxysmal atrial fibrillation) ([place]).   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   His family history includes Arrhythmia in his mother; Breast cancer in his mother; Healthy in his father.He reports that he has never smoked. He has never used smokeless  tobacco. He reports that he does not drink alcohol or use drugs.  Current Outpatient Prescriptions on File Prior to Visit  Medication Sig Dispense Refill  . amLODipine (NORVASC) 10 MG tablet Take 1 Tablet by mouth once daily 90 tablet 3  . aspirin 81 MG tablet Take 81 mg by mouth daily.    . blood glucose meter kit and supplies KIT Dispense based on patient and insurance preference. Use up to four times daily as directed. (FOR ICD-9 250.00, 250.01). 1 each 0  . clopidogrel (PLAVIX) 75 MG tablet Take 1 tablet (75 mg total) by mouth daily. 30 tablet 11  . irbesartan (AVAPRO) 300 MG tablet Take 1 tablet (300 mg total) by mouth daily. 30 tablet 5  . meclizine (ANTIVERT) 12.5 MG tablet Take 1 tablet (12.5 mg total) by mouth 3 (three) times daily as needed for dizziness. 10 tablet 0  . metoprolol tartrate (LOPRESSOR) 50 MG tablet Take 1 tablet (50 mg total) by mouth 2 (two) times daily. Please keep 07/18/17 appt for further refill authorization 60 tablet 0  . nitroGLYCERIN (NITROSTAT) 0.4 MG SL tablet DISSOLVE 1 TABLET UNDER THE TONGUE EVERY 5 MINUTES AS NEEDED FOR CHEST PAIN. DO NOT EXCEED A TOTAL OF 3 DOSES IN 15 MINUTES. 25 tablet 2  . potassium chloride (K-DUR) 10 MEQ tablet Take 1 tablet (10 mEq total) by mouth daily. 90 tablet 1   No current facility-administered medications on file prior to visit.     ROS Review of Systems  Constitutional: Negative for chills, diaphoresis, fever and unexpected weight change.  HENT: Negative for congestion, hearing loss, rhinorrhea and sore throat.   Eyes: Negative for visual disturbance.  Respiratory: Negative for cough and shortness of breath.   Cardiovascular: Negative for chest pain.  Gastrointestinal: Negative for abdominal pain, constipation and diarrhea.  Genitourinary: Negative for dysuria and flank pain.  Musculoskeletal: Negative for arthralgias and joint swelling.  Skin: Negative for rash.  Neurological: Negative for dizziness (None since before  last visit to the office.) and headaches.  Psychiatric/Behavioral: Negative for dysphoric mood and sleep disturbance.    Objective:  BP 137/64   Pulse 82   Ht '5\' 9"'$  (1.753 m)   Wt 160 lb (72.6 kg)   BMI 23.63 kg/m   BP Readings from Last 3 Encounters:  07/11/17 137/64  05/28/17 138/74  04/27/17 126/75    Wt Readings from Last 3 Encounters:  07/11/17 160 lb (72.6 kg)  05/28/17 163 lb 8 oz (74.2 kg)  04/27/17 165 lb (74.8 kg)     Physical Exam  Constitutional: He is oriented to person, place, and time. He appears well-developed and well-nourished. No distress.  HENT:  Head: Normocephalic and atraumatic.  Right Ear: External ear normal.  Left Ear: External ear normal.  Nose: Nose normal.  Mouth/Throat: Oropharynx is clear and moist.  Eyes: Pupils are equal, round, and reactive to light. Conjunctivae and EOM are normal.  Neck: Normal range of motion. Neck supple. No thyromegaly present.  Cardiovascular: Normal rate, regular rhythm and normal heart sounds.   No murmur heard. Pulmonary/Chest: Effort normal and breath sounds normal. No respiratory distress. He has no wheezes. He has no rales.  Abdominal: Soft. Bowel sounds are normal. He exhibits no distension. There is no tenderness.  Lymphadenopathy:    He has no cervical adenopathy.  Neurological: He is alert and oriented to person, place, and time. He has normal reflexes.  Skin: Skin is warm and dry.  Psychiatric: He has a normal mood and affect. His behavior is normal. Judgment and thought content normal.    Diabetic Foot Exam - Simple   Simple Foot Form Diabetic Foot exam was performed with the following findings:  Yes 07/11/2017  9:10 AM  Visual Inspection No deformities, no ulcerations, no other skin breakdown bilaterally:  Yes Sensation Testing Intact to touch and monofilament testing bilaterally:  Yes Pulse Check Posterior Tibialis and Dorsalis pulse intact bilaterally:  Yes Comments       Assessment &  Plan:   Patrick Potts was seen today for follow-up.  Diagnoses and all orders for this visit:  Diabetic lipidosis ([place]) -     Bayer DCA Hb A1c Waived  Essential hypertension, benign -     CBC with Differential/Platelet -     CMP14+EGFR  Chronic atrial fibrillation (HCC)  Hyperbilirubinemia  Other orders -     metFORMIN (GLUCOPHAGE-XR) 500 MG 24 hr tablet; Take 1 Tablet by mouth once daily with breakfast   I am having Patrick Potts maintain his aspirin, potassium chloride, clopidogrel, amLODipine, meclizine, blood glucose meter kit and supplies, irbesartan, nitroGLYCERIN, metoprolol tartrate, and metFORMIN.  Meds ordered this encounter  Medications  . metFORMIN (GLUCOPHAGE-XR) 500 MG 24 hr tablet    Sig: Take 1 Tablet by mouth once daily with breakfast    Dispense:  30 tablet    Refill:  2    This prescription was filled on 06/26/2017. Any refills authorized will be placed on file.   Pt. Would like to DC cardiology for now. Has trouble getting around in [place]. Just having annual checks for a. Fib. Nothing interventional desired. I agreed to follow him for now. Will refer back to cardiology if situation changes, consider satellite clinic here in [place] if necessary  Follow-up: Return  in about 3 months (around 10/10/2017).  Claretta Fraise, M.D.

## 2017-07-11 NOTE — Patient Instructions (Signed)

## 2017-07-12 LAB — CMP14+EGFR
ALT: 12 IU/L (ref 0–44)
AST: 17 IU/L (ref 0–40)
Albumin/Globulin Ratio: 1.8 (ref 1.2–2.2)
Albumin: 4.2 g/dL (ref 3.5–4.7)
Alkaline Phosphatase: 57 IU/L (ref 39–117)
BUN/Creatinine Ratio: 21 (ref 10–24)
BUN: 23 mg/dL (ref 8–27)
Bilirubin Total: 1.5 mg/dL — ABNORMAL HIGH (ref 0.0–1.2)
CO2: 23 mmol/L (ref 20–29)
Calcium: 9.4 mg/dL (ref 8.6–10.2)
Chloride: 107 mmol/L — ABNORMAL HIGH (ref 96–106)
Creatinine, Ser: 1.07 mg/dL (ref 0.76–1.27)
GFR calc Af Amer: 71 mL/min/1.73
GFR calc non Af Amer: 61 mL/min/1.73
Globulin, Total: 2.4 g/dL (ref 1.5–4.5)
Glucose: 132 mg/dL — ABNORMAL HIGH (ref 65–99)
Potassium: 4.2 mmol/L (ref 3.5–5.2)
Sodium: 145 mmol/L — ABNORMAL HIGH (ref 134–144)
Total Protein: 6.6 g/dL (ref 6.0–8.5)

## 2017-07-12 LAB — CBC WITH DIFFERENTIAL/PLATELET
Basophils Absolute: 0 x10E3/uL (ref 0.0–0.2)
Basos: 0 %
EOS (ABSOLUTE): 0.2 x10E3/uL (ref 0.0–0.4)
Eos: 4 %
Hematocrit: 40.8 % (ref 37.5–51.0)
Hemoglobin: 13.7 g/dL (ref 13.0–17.7)
Immature Grans (Abs): 0 x10E3/uL (ref 0.0–0.1)
Immature Granulocytes: 0 %
Lymphocytes Absolute: 1 x10E3/uL (ref 0.7–3.1)
Lymphs: 18 %
MCH: 30.9 pg (ref 26.6–33.0)
MCHC: 33.6 g/dL (ref 31.5–35.7)
MCV: 92 fL (ref 79–97)
Monocytes Absolute: 0.5 x10E3/uL (ref 0.1–0.9)
Monocytes: 10 %
Neutrophils Absolute: 3.7 x10E3/uL (ref 1.4–7.0)
Neutrophils: 68 %
Platelets: 151 x10E3/uL (ref 150–379)
RBC: 4.44 x10E6/uL (ref 4.14–5.80)
RDW: 13.8 % (ref 12.3–15.4)
WBC: 5.5 x10E3/uL (ref 3.4–10.8)

## 2017-07-18 ENCOUNTER — Ambulatory Visit: Payer: Medicare Other | Admitting: Interventional Cardiology

## 2017-07-23 ENCOUNTER — Other Ambulatory Visit: Payer: Self-pay | Admitting: Interventional Cardiology

## 2017-08-31 ENCOUNTER — Other Ambulatory Visit: Payer: Self-pay | Admitting: Interventional Cardiology

## 2017-09-10 DIAGNOSIS — L989 Disorder of the skin and subcutaneous tissue, unspecified: Secondary | ICD-10-CM | POA: Diagnosis not present

## 2017-09-10 DIAGNOSIS — D485 Neoplasm of uncertain behavior of skin: Secondary | ICD-10-CM | POA: Diagnosis not present

## 2017-09-19 ENCOUNTER — Other Ambulatory Visit: Payer: Self-pay | Admitting: Family Medicine

## 2017-10-01 ENCOUNTER — Telehealth: Payer: Self-pay | Admitting: Family Medicine

## 2017-10-03 ENCOUNTER — Other Ambulatory Visit: Payer: Self-pay

## 2017-10-03 DIAGNOSIS — C44229 Squamous cell carcinoma of skin of left ear and external auricular canal: Secondary | ICD-10-CM | POA: Diagnosis not present

## 2017-10-03 MED ORDER — CLOPIDOGREL BISULFATE 75 MG PO TABS: 75.0000 mg | ORAL_TABLET | Freq: Every day | ORAL | 2 refills | Status: DC

## 2017-10-08 MED ORDER — AMLODIPINE BESYLATE 10 MG PO TABS: 10.0000 mg | ORAL_TABLET | Freq: Every day | ORAL | 1 refills | Status: DC

## 2017-10-08 MED ORDER — METOPROLOL TARTRATE 50 MG PO TABS: 50.0000 mg | ORAL_TABLET | Freq: Two times a day (BID) | ORAL | 1 refills | Status: DC

## 2017-10-08 MED ORDER — METFORMIN HCL ER 500 MG PO TB24: ORAL_TABLET | ORAL | 1 refills | Status: DC

## 2017-10-08 MED ORDER — IRBESARTAN 300 MG PO TABS: 300.0000 mg | ORAL_TABLET | Freq: Every day | ORAL | 1 refills | Status: DC

## 2017-10-08 NOTE — Telephone Encounter (Signed)
Sent RFs on amlodipine, irbesartan, metformin, metoprolol. Was not able to sent plavix at this time Sent to mail order pharmacy

## 2017-10-12 ENCOUNTER — Ambulatory Visit: Payer: Medicare Other | Admitting: Family Medicine

## 2017-10-12 VITALS — BP 123/64 | HR 64 | Temp 97.3°F | Ht 69.0 in | Wt 161.0 lb

## 2017-10-12 DIAGNOSIS — I482 Chronic atrial fibrillation, unspecified: Secondary | ICD-10-CM

## 2017-10-12 DIAGNOSIS — E756 Lipid storage disorder, unspecified: Secondary | ICD-10-CM | POA: Diagnosis not present

## 2017-10-12 DIAGNOSIS — I10 Essential (primary) hypertension: Secondary | ICD-10-CM | POA: Diagnosis not present

## 2017-10-12 DIAGNOSIS — E1169 Type 2 diabetes mellitus with other specified complication: Secondary | ICD-10-CM

## 2017-10-12 LAB — CBC WITH DIFFERENTIAL/PLATELET
Basophils Absolute: 0 10*3/uL (ref 0.0–0.2)
Basos: 0 %
EOS (ABSOLUTE): 0.2 10*3/uL (ref 0.0–0.4)
Eos: 4 %
Hematocrit: 40.8 % (ref 37.5–51.0)
Hemoglobin: 13.7 g/dL (ref 13.0–17.7)
IMMATURE GRANULOCYTES: 0 %
Immature Grans (Abs): 0 10*3/uL (ref 0.0–0.1)
LYMPHS ABS: 1.4 10*3/uL (ref 0.7–3.1)
Lymphs: 22 %
MCH: 31.2 pg (ref 26.6–33.0)
MCHC: 33.6 g/dL (ref 31.5–35.7)
MCV: 93 fL (ref 79–97)
MONOS ABS: 0.7 10*3/uL (ref 0.1–0.9)
Monocytes: 11 %
NEUTROS PCT: 63 %
Neutrophils Absolute: 4 10*3/uL (ref 1.4–7.0)
PLATELETS: 194 10*3/uL (ref 150–379)
RBC: 4.39 x10E6/uL (ref 4.14–5.80)
RDW: 13.4 % (ref 12.3–15.4)
WBC: 6.4 10*3/uL (ref 3.4–10.8)

## 2017-10-12 LAB — CMP14+EGFR
A/G RATIO: 1.9 (ref 1.2–2.2)
ALK PHOS: 60 IU/L (ref 39–117)
ALT: 13 IU/L (ref 0–44)
AST: 18 IU/L (ref 0–40)
Albumin: 4.1 g/dL (ref 3.2–4.6)
BUN/Creatinine Ratio: 19 (ref 10–24)
BUN: 20 mg/dL (ref 10–36)
Bilirubin Total: 1.7 mg/dL — ABNORMAL HIGH (ref 0.0–1.2)
CALCIUM: 9.5 mg/dL (ref 8.6–10.2)
CHLORIDE: 103 mmol/L (ref 96–106)
CO2: 25 mmol/L (ref 20–29)
Creatinine, Ser: 1.04 mg/dL (ref 0.76–1.27)
GFR calc Af Amer: 73 mL/min/{1.73_m2} (ref >59)
GFR, EST NON AFRICAN AMERICAN: 63 mL/min/{1.73_m2} (ref >59)
Globulin, Total: 2.2 g/dL (ref 1.5–4.5)
Glucose: 138 mg/dL — ABNORMAL HIGH (ref 65–99)
POTASSIUM: 3.8 mmol/L (ref 3.5–5.2)
SODIUM: 142 mmol/L (ref 134–144)
Total Protein: 6.3 g/dL (ref 6.0–8.5)

## 2017-10-12 LAB — BAYER DCA HB A1C WAIVED: HB A1C: 6.4 % (ref <7.0)

## 2017-10-12 MED ORDER — RIVASTIGMINE 4.6 MG/24HR TD PT24: 4.6000 mg | MEDICATED_PATCH | Freq: Every day | TRANSDERMAL | 3 refills | Status: DC

## 2017-10-14 ENCOUNTER — Encounter: Payer: Self-pay | Admitting: Family Medicine

## 2017-10-14 NOTE — Progress Notes (Signed)
Subjective:  Patient ID: Patrick Potts,  male    DOB: 1924/12/12  Age: 83 y.o.    CC: Hypertension (pt here today for routine follow up of his chronic medical conditions)   HPI Patrick Potts presents for  follow-up of hypertension. Patient has no history of headache chest pain or shortness of breath or recent cough. Patient also denies symptoms of TIA such as numbness weakness lateralizing. Patient denies side effects from medication. States taking it regularly.  Patient also  in for follow-up of elevated cholesterol. Doing well without complaints on current medication. Denies side effects of statin including myalgia and arthralgia and nausea. Also in today for liver function testing. Currently no chest pain, shortness of breath or other cardiovascular related symptoms noted.  Follow-up of diabetes. Patient does check blood sugar at home. Readings run between 120 and 140 Patient denies symptoms such as polyuria, polydipsia, excessive hunger, nausea No significant hypoglycemic spells noted. Medications reviewed. Pt reports taking them regularly. Pt. denies complication/adverse reaction today.    History Patrick Potts has a past medical history of ASCVD (arteriosclerotic cardiovascular disease), Atrial fibrillation ([place]) (04/28/2014), Back pain, Essential hypertension, benign (04/28/2014), Myalgia, Myocardial infarction ([place]), and PAF (paroxysmal atrial fibrillation) ([place]).   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   His family history includes Arrhythmia in his mother; Breast cancer in his mother; Healthy in his father.Patrick Potts reports that  has never smoked. Patrick Potts has never used smokeless tobacco. Patrick Potts reports that Patrick Potts does not drink alcohol or use drugs.  Current Outpatient Medications on File Prior to Visit  Medication Sig Dispense Refill  . amLODipine (NORVASC) 10 MG tablet Take 1 tablet (10  mg total) by mouth daily. 90 tablet 1  . aspirin 81 MG tablet Take 81 mg by mouth daily.    . blood glucose meter kit and supplies KIT Dispense based on patient and insurance preference. Use up to four times daily as directed. (FOR ICD-9 250.00, 250.01). 1 each 0  . clopidogrel (PLAVIX) 75 MG tablet Take 1 tablet (75 mg total) by mouth daily. 30 tablet 2  . irbesartan (AVAPRO) 300 MG tablet Take 1 tablet (300 mg total) by mouth daily. 90 tablet 1  . meclizine (ANTIVERT) 12.5 MG tablet Take 1 tablet (12.5 mg total) by mouth 3 (three) times daily as needed for dizziness. 10 tablet 0  . metFORMIN (GLUCOPHAGE-XR) 500 MG 24 hr tablet Take 1 Tablet by mouth once daily with breakfast 90 tablet 1  . metoprolol tartrate (LOPRESSOR) 50 MG tablet Take 1 tablet (50 mg total) by mouth 2 (two) times daily. 180 tablet 1  . nitroGLYCERIN (NITROSTAT) 0.4 MG SL tablet DISSOLVE 1 TABLET UNDER THE TONGUE EVERY 5 MINUTES AS NEEDED FOR CHEST PAIN. DO NOT EXCEED A TOTAL OF 3 DOSES IN 15 MINUTES. 25 tablet 2  . potassium chloride (K-DUR) 10 MEQ tablet Take 1 tablet (10 mEq total) by mouth daily. 90 tablet 1   No current facility-administered medications on file prior to visit.     ROS Review of Systems  Constitutional: Negative for chills, diaphoresis, fever and unexpected weight change.  HENT: Negative for congestion, hearing loss, rhinorrhea and sore throat.   Eyes: Negative for visual disturbance.  Respiratory: Negative for cough and shortness of breath.   Cardiovascular: Negative for chest pain.  Gastrointestinal: Negative for abdominal pain, constipation and diarrhea.  Genitourinary: Negative for dysuria and flank pain.  Musculoskeletal: Negative  for arthralgias and joint swelling.  Skin: Negative for rash.  Neurological: Negative for dizziness and headaches.  Psychiatric/Behavioral: Negative for dysphoric mood and sleep disturbance.    Objective:  BP 123/64   Pulse 64   Temp (!) 97.3 F (36.3 C)  (Oral)   Ht 5' 9" (1.753 m)   Wt 161 lb (73 kg)   BMI 23.78 kg/m   BP Readings from Last 3 Encounters:  10/12/17 123/64  07/11/17 137/64  05/28/17 138/74    Wt Readings from Last 3 Encounters:  10/12/17 161 lb (73 kg)  07/11/17 160 lb (72.6 kg)  05/28/17 163 lb 8 oz (74.2 kg)     Physical Exam  Constitutional: Patrick Potts is oriented to person, place, and time. Patrick Potts appears well-developed and well-nourished. No distress.  HENT:  Head: Normocephalic and atraumatic.  Right Ear: External ear normal.  Left Ear: External ear normal.  Nose: Nose normal.  Mouth/Throat: Oropharynx is clear and moist.  Eyes: Conjunctivae and EOM are normal. Pupils are equal, round, and reactive to light.  Neck: Normal range of motion. Neck supple. No thyromegaly present.  Cardiovascular: Normal rate, regular rhythm and normal heart sounds.  No murmur heard. Pulmonary/Chest: Effort normal and breath sounds normal. No respiratory distress. Patrick Potts has no wheezes. Patrick Potts has no rales.  Abdominal: Soft. Bowel sounds are normal. Patrick Potts exhibits no distension. There is no tenderness.  Lymphadenopathy:    Patrick Potts has no cervical adenopathy.  Neurological: Patrick Potts is alert and oriented to person, place, and time. Patrick Potts has normal reflexes.  Skin: Skin is warm and dry.  Psychiatric: Patrick Potts has a normal mood and affect. His behavior is normal. Judgment and thought content normal.    Diabetic Foot Exam - Simple   No data filed        Assessment & Plan:   Patrick Potts was seen today for hypertension.  Diagnoses and all orders for this visit:  Diabetic lipidosis ([place]) -     Bayer DCA Hb A1c Waived  Essential hypertension, benign -     CBC with Differential/Platelet -     CMP14+EGFR  Chronic atrial fibrillation (HCC)  Other orders -     rivastigmine (EXELON) 4.6 mg/24hr; Place 1 patch (4.6 mg total) onto the skin at bedtime.   I am having Patrick Potts start on rivastigmine. I am also having him maintain his aspirin, potassium  chloride, meclizine, blood glucose meter kit and supplies, nitroGLYCERIN, clopidogrel, amLODipine, irbesartan, metFORMIN, and metoprolol tartrate.  Meds ordered this encounter  Medications  . rivastigmine (EXELON) 4.6 mg/24hr    Sig: Place 1 patch (4.6 mg total) onto the skin at bedtime.    Dispense:  90 patch    Refill:  3     Follow-up: Return in about 3 months (around 01/10/2018).  Claretta Fraise, M.D.

## 2017-10-19 ENCOUNTER — Telehealth: Payer: Self-pay | Admitting: Family Medicine

## 2017-10-19 MED ORDER — METFORMIN HCL ER 500 MG PO TB24: ORAL_TABLET | ORAL | 1 refills | Status: DC

## 2017-10-19 NOTE — Telephone Encounter (Signed)
I sent in metformin to [facility]. If it is expensive let us know, we could switch to short acting which is on the $4 list. If he misses a few days of the metformin that is OK too, it sounds like it is on the way from mail order.

## 2017-10-19 NOTE — Telephone Encounter (Signed)
Patient is out of his Metformin.  It was filled on 10/08/17 and sent to mail order pharmacy.  She has contacted them and they have told her they are behind because of the winter weather and are getting meds shipped out as quickly as possible.  They would like to know if we will send in a prescription for Metformin to a local pharmacy and she will pay cash.  Please advise.

## 2017-10-28 ENCOUNTER — Other Ambulatory Visit: Payer: Self-pay | Admitting: Family Medicine

## 2017-11-07 ENCOUNTER — Other Ambulatory Visit: Payer: Self-pay | Admitting: *Deleted

## 2017-11-07 MED ORDER — CLOPIDOGREL BISULFATE 75 MG PO TABS: 75.0000 mg | ORAL_TABLET | Freq: Every day | ORAL | 0 refills | Status: DC

## 2017-12-15 ENCOUNTER — Other Ambulatory Visit: Payer: Self-pay | Admitting: Family Medicine

## 2018-01-11 ENCOUNTER — Encounter: Payer: Self-pay | Admitting: Family Medicine

## 2018-01-11 ENCOUNTER — Ambulatory Visit: Payer: Medicare Other | Admitting: Family Medicine

## 2018-01-11 VITALS — BP 134/68 | HR 45 | Temp 96.8°F | Ht 69.0 in | Wt 166.0 lb

## 2018-01-11 DIAGNOSIS — I482 Chronic atrial fibrillation, unspecified: Secondary | ICD-10-CM

## 2018-01-11 DIAGNOSIS — F0280 Dementia in other diseases classified elsewhere without behavioral disturbance: Secondary | ICD-10-CM | POA: Diagnosis not present

## 2018-01-11 DIAGNOSIS — E1169 Type 2 diabetes mellitus with other specified complication: Secondary | ICD-10-CM

## 2018-01-11 DIAGNOSIS — G301 Alzheimer's disease with late onset: Secondary | ICD-10-CM | POA: Diagnosis not present

## 2018-01-11 DIAGNOSIS — I10 Essential (primary) hypertension: Secondary | ICD-10-CM | POA: Diagnosis not present

## 2018-01-11 DIAGNOSIS — E756 Lipid storage disorder, unspecified: Secondary | ICD-10-CM

## 2018-01-11 LAB — BAYER DCA HB A1C WAIVED: HB A1C (BAYER DCA - WAIVED): 6 % (ref <7.0)

## 2018-01-11 MED ORDER — RIVASTIGMINE 9.5 MG/24HR TD PT24: 9.5000 mg | MEDICATED_PATCH | Freq: Every day | TRANSDERMAL | 1 refills | Status: DC

## 2018-01-11 NOTE — Progress Notes (Signed)
Subjective:  Patient ID: Patrick Potts,  male    DOB: 1925-04-27  Age: 84 y.o.    CC: Diabetes   HPI Patrick Potts presents for  follow-up of hypertension. Patient has no history of headache chest pain or shortness of breath or recent cough. Patient also denies symptoms of TIA such as numbness weakness lateralizing. Patient checks  blood pressure at home. Recent readings have been good Patient denies side effects from medication. States taking it regularly.  Patient also  in for follow-up of elevated cholesterol. Doing well without complaints on current medication. Denies side effects of statin including myalgia and arthralgia and nausea. Also in today for liver function testing. Currently no chest pain, shortness of breath or other cardiovascular related symptoms noted.  Follow-up of diabetes. Patient does check blood sugar at home. Readings run between 120 and 130 Patient denies symptoms such as polyuria, polydipsia, excessive hunger, nausea No significant hypoglycemic spells noted. Medications reviewed. Pt reports taking them regularly. Pt. denies complication/adverse reaction today.   Caretaker describes increasing memory and cognition failure. Pt. Unable to remember date.  History Gad has a past medical history of ASCVD (arteriosclerotic cardiovascular disease), Atrial fibrillation ([place]) (04/28/2014), Back pain, Essential hypertension, benign (04/28/2014), Myalgia, Myocardial infarction ([place]), and PAF (paroxysmal atrial fibrillation) ([place]).   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   His family history includes Arrhythmia in his mother; Breast cancer in his mother; Healthy in his father.He reports that  has never smoked. he has never used smokeless tobacco. He reports that he does not drink alcohol or use drugs.  Current Outpatient Medications on File Prior to Visit    Medication Sig Dispense Refill  . amLODipine (NORVASC) 10 MG tablet Take 1 tablet (10 mg total) by mouth daily. 90 tablet 1  . aspirin 81 MG tablet Take 81 mg by mouth daily.    . blood glucose meter kit and supplies KIT Dispense based on patient and insurance preference. Use up to four times daily as directed. (FOR ICD-9 250.00, 250.01). 1 each 0  . clopidogrel (PLAVIX) 75 MG tablet Take 1 tablet (75 mg total) by mouth daily. 90 tablet 0  . irbesartan (AVAPRO) 300 MG tablet TAKE 1 TABLET BY MOUTH ONCE DAILY 30 tablet 5  . meclizine (ANTIVERT) 12.5 MG tablet Take 1 tablet (12.5 mg total) by mouth 3 (three) times daily as needed for dizziness. 10 tablet 0  . metFORMIN (GLUCOPHAGE-XR) 500 MG 24 hr tablet Take 1 Tablet by mouth once daily with breakfast 30 tablet 1  . metoprolol tartrate (LOPRESSOR) 50 MG tablet Take 1 tablet (50 mg total) by mouth 2 (two) times daily. 180 tablet 1  . metoprolol tartrate (LOPRESSOR) 50 MG tablet TAKE 1 TABLET (50 MG TOTAL) 2 (TWO) TIMES DAILY BY MOUTH. 180 tablet 0  . nitroGLYCERIN (NITROSTAT) 0.4 MG SL tablet DISSOLVE 1 TABLET UNDER THE TONGUE EVERY 5 MINUTES AS NEEDED FOR CHEST PAIN. DO NOT EXCEED A TOTAL OF 3 DOSES IN 15 MINUTES. 25 tablet 2  . potassium chloride (K-DUR) 10 MEQ tablet Take 1 tablet (10 mEq total) by mouth daily. 90 tablet 1   No current facility-administered medications on file prior to visit.     ROS Review of Systems  Constitutional: Negative for chills, diaphoresis, fever and unexpected weight change.  HENT: Negative for congestion, hearing loss, rhinorrhea and sore throat.   Eyes: Negative for visual disturbance.  Respiratory:  Negative for cough and shortness of breath.   Cardiovascular: Negative for chest pain.  Gastrointestinal: Negative for abdominal pain, constipation and diarrhea.  Genitourinary: Negative for dysuria and flank pain.  Musculoskeletal: Negative for arthralgias and joint swelling.  Skin: Negative for rash.   Neurological: Negative for dizziness and headaches.  Psychiatric/Behavioral: Negative for dysphoric mood and sleep disturbance.    Objective:  BP 134/68   Pulse (!) 45   Temp (!) 96.8 F (36 C) (Oral)   Ht '5\' 9"'$  (1.753 m)   Wt 166 lb (75.3 kg)   BMI 24.51 kg/m   BP Readings from Last 3 Encounters:  01/11/18 134/68  10/12/17 123/64  07/11/17 137/64    Wt Readings from Last 3 Encounters:  01/11/18 166 lb (75.3 kg)  10/12/17 161 lb (73 kg)  07/11/17 160 lb (72.6 kg)     Physical Exam  Constitutional: He is oriented to person, place, and time. He appears well-developed and well-nourished. No distress.  HENT:  Head: Normocephalic and atraumatic.  Right Ear: External ear normal.  Left Ear: External ear normal.  Nose: Nose normal.  Mouth/Throat: Oropharynx is clear and moist.  Eyes: Conjunctivae and EOM are normal. Pupils are equal, round, and reactive to light.  Neck: Normal range of motion. Neck supple. No thyromegaly present.  Cardiovascular: Normal rate, regular rhythm and normal heart sounds.  No murmur heard. Pulmonary/Chest: Effort normal and breath sounds normal. No respiratory distress. He has no wheezes. He has no rales.  Abdominal: Soft. Bowel sounds are normal. He exhibits no distension. There is no tenderness.  Lymphadenopathy:    He has no cervical adenopathy.  Neurological: He is alert and oriented to person, place, and time. He has normal reflexes.  Skin: Skin is warm and dry.  Psychiatric: He has a normal mood and affect. His speech is normal and behavior is normal. Cognition and memory are impaired. He exhibits abnormal recent memory.    Diabetic Foot Exam - Simple   Simple Foot Form Diabetic Foot exam was performed with the following findings:  Yes 01/11/2018  9:00 AM  Visual Inspection No deformities, no ulcerations, no other skin breakdown bilaterally:  Yes Sensation Testing Intact to touch and monofilament testing bilaterally:  Yes Pulse  Check Posterior Tibialis and Dorsalis pulse intact bilaterally:  Yes Comments       Assessment & Plan:   Patrick Potts was seen today for diabetes.  Diagnoses and all orders for this visit:  Essential hypertension, benign -     CMP14+EGFR  Diabetic lipidosis ([place]) -     Bayer DCA Hb A1c Waived -     Ambulatory referral to Podiatry  Chronic atrial fibrillation ([place])  Late onset Alzheimer's disease without behavioral disturbance  Other orders -     rivastigmine (EXELON) 9.5 mg/24hr; Place 1 patch (9.5 mg total) onto the skin daily.   I have discontinued Patrick Potts's rivastigmine. I am also having him start on rivastigmine. Additionally, I am having him maintain his aspirin, potassium chloride, meclizine, blood glucose meter kit and supplies, nitroGLYCERIN, amLODipine, metoprolol tartrate, metFORMIN, irbesartan, clopidogrel, and metoprolol tartrate.  Meds ordered this encounter  Medications  . rivastigmine (EXELON) 9.5 mg/24hr    Sig: Place 1 patch (9.5 mg total) onto the skin daily.    Dispense:  90 patch    Refill:  1     Follow-up: Return in about 3 months (around 04/13/2018).  Claretta Fraise, M.D.

## 2018-01-11 NOTE — Patient Instructions (Signed)

## 2018-01-12 LAB — CMP14+EGFR
ALK PHOS: 64 IU/L (ref 39–117)
ALT: 14 IU/L (ref 0–44)
AST: 17 IU/L (ref 0–40)
Albumin/Globulin Ratio: 2 (ref 1.2–2.2)
Albumin: 4.5 g/dL (ref 3.2–4.6)
BUN/Creatinine Ratio: 15 (ref 10–24)
BUN: 17 mg/dL (ref 10–36)
Bilirubin Total: 1.6 mg/dL — ABNORMAL HIGH (ref 0.0–1.2)
CO2: 24 mmol/L (ref 20–29)
CREATININE: 1.17 mg/dL (ref 0.76–1.27)
Calcium: 9.5 mg/dL (ref 8.6–10.2)
Chloride: 103 mmol/L (ref 96–106)
GFR calc Af Amer: 63 mL/min/{1.73_m2} (ref >59)
GFR calc non Af Amer: 55 mL/min/{1.73_m2} — ABNORMAL LOW (ref >59)
GLUCOSE: 173 mg/dL — AB (ref 65–99)
Globulin, Total: 2.2 g/dL (ref 1.5–4.5)
Potassium: 3.9 mmol/L (ref 3.5–5.2)
SODIUM: 142 mmol/L (ref 134–144)
Total Protein: 6.7 g/dL (ref 6.0–8.5)

## 2018-01-13 ENCOUNTER — Encounter: Payer: Self-pay | Admitting: Family Medicine

## 2018-01-13 DIAGNOSIS — G301 Alzheimer's disease with late onset: Secondary | ICD-10-CM

## 2018-01-13 DIAGNOSIS — F0280 Dementia in other diseases classified elsewhere without behavioral disturbance: Secondary | ICD-10-CM | POA: Insufficient documentation

## 2018-02-20 ENCOUNTER — Other Ambulatory Visit: Payer: Self-pay | Admitting: Family Medicine

## 2018-03-14 ENCOUNTER — Other Ambulatory Visit: Payer: Self-pay | Admitting: Family Medicine

## 2018-04-13 ENCOUNTER — Other Ambulatory Visit: Payer: Self-pay | Admitting: Family Medicine

## 2018-04-15 ENCOUNTER — Ambulatory Visit: Payer: Medicare Other | Admitting: Family Medicine

## 2018-04-30 ENCOUNTER — Other Ambulatory Visit: Payer: Self-pay | Admitting: Family Medicine

## 2018-05-10 ENCOUNTER — Other Ambulatory Visit: Payer: Self-pay | Admitting: Family Medicine

## 2018-05-31 ENCOUNTER — Other Ambulatory Visit: Payer: Self-pay | Admitting: Family Medicine

## 2018-05-31 NOTE — Telephone Encounter (Signed)
Last seen 01/11/18

## 2018-06-30 ENCOUNTER — Other Ambulatory Visit: Payer: Self-pay | Admitting: Family Medicine

## 2018-07-02 ENCOUNTER — Other Ambulatory Visit: Payer: Self-pay | Admitting: Family Medicine

## 2018-07-16 ENCOUNTER — Other Ambulatory Visit: Payer: Self-pay | Admitting: Family Medicine

## 2018-07-17 NOTE — Telephone Encounter (Signed)
Last seen 3/19  Dr Darlyn Read

## 2018-07-30 ENCOUNTER — Other Ambulatory Visit: Payer: Self-pay | Admitting: Family Medicine

## 2018-08-16 ENCOUNTER — Other Ambulatory Visit: Payer: Self-pay | Admitting: Family Medicine

## 2018-09-06 ENCOUNTER — Other Ambulatory Visit: Payer: Self-pay | Admitting: Family Medicine

## 2018-09-06 NOTE — Telephone Encounter (Signed)
Last seen 01/11/18  Dr Stacks 

## 2018-09-17 ENCOUNTER — Other Ambulatory Visit: Payer: Self-pay | Admitting: Family Medicine

## 2018-09-18 NOTE — Telephone Encounter (Signed)
Authorize 30 days only. Then contact the patient letting them know that they will need an appointment before any further prescriptions can be sent in. 

## 2018-10-01 ENCOUNTER — Other Ambulatory Visit: Payer: Self-pay | Admitting: Family Medicine

## 2018-10-02 ENCOUNTER — Other Ambulatory Visit: Payer: Self-pay | Admitting: Family Medicine

## 2018-10-02 NOTE — Telephone Encounter (Signed)
Authorize 30 days only. Then contact the patient letting them know that they will need an appointment before any further prescriptions can be sent in. 

## 2018-10-02 NOTE — Telephone Encounter (Signed)
Last seen 01/11/18

## 2018-10-02 NOTE — Telephone Encounter (Signed)
Pt's wife is aware and she states he is getting dementia really bad and refusing to be seen. Advised her that pt must be seen for further refills and she voiced understanding.

## 2018-10-17 ENCOUNTER — Other Ambulatory Visit: Payer: Self-pay | Admitting: Family Medicine

## 2018-10-18 MED ORDER — CLOPIDOGREL BISULFATE 75 MG PO TABS: 75.0000 mg | ORAL_TABLET | Freq: Every day | ORAL | 0 refills | Status: DC

## 2018-10-18 NOTE — Telephone Encounter (Signed)
Patient needs an appt.

## 2018-10-18 NOTE — Addendum Note (Signed)
Addended by: Caryl BisBOWMAN, Natanel Snavely M on: 10/18/2018 02:49 PM   Modules accepted: Orders

## 2018-10-21 ENCOUNTER — Ambulatory Visit (INDEPENDENT_AMBULATORY_CARE_PROVIDER_SITE_OTHER): Payer: Medicare Other | Admitting: Family Medicine

## 2018-10-21 ENCOUNTER — Encounter: Payer: Self-pay | Admitting: Family Medicine

## 2018-10-21 VITALS — BP 133/80 | HR 64 | Temp 97.4°F | Ht 69.0 in | Wt 167.8 lb

## 2018-10-21 DIAGNOSIS — G301 Alzheimer's disease with late onset: Secondary | ICD-10-CM | POA: Diagnosis not present

## 2018-10-21 DIAGNOSIS — I482 Chronic atrial fibrillation, unspecified: Secondary | ICD-10-CM | POA: Diagnosis not present

## 2018-10-21 DIAGNOSIS — F0280 Dementia in other diseases classified elsewhere without behavioral disturbance: Secondary | ICD-10-CM

## 2018-10-21 DIAGNOSIS — E1169 Type 2 diabetes mellitus with other specified complication: Secondary | ICD-10-CM | POA: Diagnosis not present

## 2018-10-21 DIAGNOSIS — E756 Lipid storage disorder, unspecified: Secondary | ICD-10-CM

## 2018-10-21 DIAGNOSIS — Z125 Encounter for screening for malignant neoplasm of prostate: Secondary | ICD-10-CM

## 2018-10-21 DIAGNOSIS — I25118 Atherosclerotic heart disease of native coronary artery with other forms of angina pectoris: Secondary | ICD-10-CM

## 2018-10-21 MED ORDER — IRBESARTAN 300 MG PO TABS: 300.0000 mg | ORAL_TABLET | Freq: Every day | ORAL | 1 refills | Status: DC

## 2018-10-21 MED ORDER — METFORMIN HCL ER 500 MG PO TB24: 500.0000 mg | ORAL_TABLET | Freq: Every day | ORAL | 1 refills | Status: DC

## 2018-10-21 MED ORDER — CLOPIDOGREL BISULFATE 75 MG PO TABS: 75.0000 mg | ORAL_TABLET | Freq: Every day | ORAL | 1 refills | Status: DC

## 2018-10-21 MED ORDER — METOPROLOL TARTRATE 50 MG PO TABS: 75.0000 mg | ORAL_TABLET | Freq: Two times a day (BID) | ORAL | 1 refills | Status: DC

## 2018-10-21 MED ORDER — METOPROLOL TARTRATE 50 MG PO TABS: 50.0000 mg | ORAL_TABLET | Freq: Two times a day (BID) | ORAL | 1 refills | Status: DC

## 2018-10-21 MED ORDER — AMLODIPINE BESYLATE 10 MG PO TABS: ORAL_TABLET | ORAL | 1 refills | Status: DC

## 2018-10-21 MED ORDER — RIVASTIGMINE 9.5 MG/24HR TD PT24: 9.5000 mg | MEDICATED_PATCH | Freq: Every day | TRANSDERMAL | 1 refills | Status: DC

## 2018-10-21 MED ORDER — MEMANTINE HCL ER 7 & 14 & 21 &28 MG PO CP24: ORAL_CAPSULE | ORAL | 0 refills | Status: DC

## 2018-10-21 NOTE — Progress Notes (Signed)
Subjective:  Patient ID: Patrick Potts,  male    DOB: [birth year]  Age: 84 y.o.    CC: Medical Management of Chronic Issues   HPI Patrick Potts presents for  follow-up of hypertension. Patient has no history of headache,or shortness of breath or recent cough. Patient also denies symptoms of TIA such as numbness weakness lateralizing. Patient denies side effects from medication. States taking it regularly.  Patient also  in for follow-up of elevated cholesterol. Doing well without complaints on current medication. Denies side effects  including myalgia and arthralgia and nausea. Also in today for liver function testing. Currently no shortness of breath or other cardiovascular related symptoms noted.  HAving chest pain with exertion - walking up a hill. Going through more nitroglycerin. Gets relief with rest after 2nd dose. Occurring 2-3 per week. No radiation.  Follow-up of diabetes. Patient does check blood sugar at home. Unable to recall recent numbers. No log returned.Patient denies symptoms such as excessive hunger or urinary frequency, excessive hunger, nausea No significant hypoglycemic spells noted. Medications reviewed. Pt reports taking them regularly. Pt. denies complication/adverse reaction today.   Memory loss. DCed patch as ineffective after 3 mos use. Having episodes of confusion. Can not remember dates, days of the week, etc per hx given by wife.  History Patrick Potts has a past medical history of ASCVD (arteriosclerotic cardiovascular disease), Atrial fibrillation ([place]) (04/28/2014), Back pain, Essential hypertension, benign (04/28/2014), Myalgia, Myocardial infarction ([place]), and PAF (paroxysmal atrial fibrillation) ([place]).   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   His family history includes Arrhythmia in his mother; Breast cancer in his mother; Healthy in his  father.He reports that he has never smoked. He has never used smokeless tobacco. He reports that he does not drink alcohol or use drugs.  Current Outpatient Medications on File Prior to Visit  Medication Sig Dispense Refill  . aspirin 81 MG tablet Take 81 mg by mouth daily.    . blood glucose meter kit and supplies KIT Dispense based on patient and insurance preference. Use up to four times daily as directed. (FOR ICD-9 250.00, 250.01). 1 each 0  . meclizine (ANTIVERT) 12.5 MG tablet Take 1 tablet (12.5 mg total) by mouth 3 (three) times daily as needed for dizziness. 10 tablet 0  . nitroGLYCERIN (NITROSTAT) 0.4 MG SL tablet ONE TAB UNDER TONGUE EVERY 5 MINS--DONT EXCEED 3 TABLETS WITHIN 15 MINUTES-CALL 911 25 tablet 0  . potassium chloride (K-DUR) 10 MEQ tablet Take 1 tablet (10 mEq total) by mouth daily. 90 tablet 1   No current facility-administered medications on file prior to visit.     ROS Review of Systems  Constitutional: Negative.   HENT: Negative.   Eyes: Negative for visual disturbance.  Respiratory: Negative for cough and shortness of breath.   Cardiovascular: Positive for chest pain (See HPI). Negative for leg swelling.  Gastrointestinal: Negative for abdominal pain, diarrhea, nausea and vomiting.  Genitourinary: Negative for difficulty urinating.  Musculoskeletal: Negative for arthralgias and myalgias.  Skin: Negative for rash.  Neurological: Negative for headaches.  Psychiatric/Behavioral: Positive for confusion and decreased concentration. Negative for sleep disturbance.    Objective:  BP 133/80   Pulse 64   Temp (!) 97.4 F (36.3 C) (Oral)   Ht _0  (1.753 m)   Wt 167 lb 12.8 oz (76.1 kg)   BMI 24.78 kg/m   BP Readings from Last 3 Encounters:  10/21/18 133/80  01/11/18 134/68  10/12/17 123/64    Wt Readings from Last 3 Encounters:  10/21/18 167 lb 12.8 oz (76.1 kg)  01/11/18 166 lb (75.3 kg)  10/12/17 161 lb (73 kg)     Physical  Exam Constitutional:      General: He is not in acute distress.    Appearance: He is well-developed.  HENT:     Head: Normocephalic and atraumatic.     Right Ear: External ear normal.     Left Ear: External ear normal.     Nose: Nose normal.  Eyes:     Conjunctiva/sclera: Conjunctivae normal.     Pupils: Pupils are equal, round, and reactive to light.  Neck:     Musculoskeletal: Normal range of motion and neck supple.  Cardiovascular:     Rate and Rhythm: Normal rate and regular rhythm.     Heart sounds: Normal heart sounds. No murmur.  Pulmonary:     Effort: Pulmonary effort is normal. No respiratory distress.     Breath sounds: Normal breath sounds. No wheezing or rales.  Abdominal:     Palpations: Abdomen is soft.     Tenderness: There is no abdominal tenderness.  Musculoskeletal: Normal range of motion.  Skin:    General: Skin is warm and dry.  Neurological:     Mental Status: He is alert and oriented to person, place, and time.     Deep Tendon Reflexes: Reflexes are normal and symmetric.  Psychiatric:        Attention and Perception: Attention normal.        Mood and Affect: Mood normal.        Speech: Speech normal.        Behavior: Behavior normal.        Thought Content: Thought content normal.        Cognition and Memory: Cognition is impaired. Memory is impaired.        Judgment: Judgment is impulsive.       Assessment & Plan:   Patrick Potts was seen today for medical management of chronic issues.  Diagnoses and all orders for this visit:  Atherosclerosis of native coronary artery of native heart with stable angina pectoris ([place]) -     CBC with Differential/Platelet -     CMP14+EGFR  Diabetic lipidosis (HCC) -     CBC with Differential/Platelet -     CMP14+EGFR -     Lipid panel -     Microalbumin / creatinine urine ratio -     Lipid panel -     Bayer DCA Hb A1c Waived -     Urinalysis  Chronic atrial fibrillation -     CBC with  Differential/Platelet -     CMP14+EGFR  Late onset Alzheimer's disease without behavioral disturbance (HCC) -     CBC with Differential/Platelet -     CMP14+EGFR  Screening for prostate cancer -     PSA, total and free  Other orders -     rivastigmine (EXELON) 9.5 mg/24hr; Place 1 patch (9.5 mg total) onto the skin daily. -     Discontinue: metoprolol tartrate (LOPRESSOR) 50 MG tablet; Take 1 tablet (50 mg total) by mouth 2 (two) times daily. -     metFORMIN (GLUCOPHAGE-XR) 500 MG 24 hr tablet; Take 1 tablet (500 mg total) by mouth daily with breakfast. -     irbesartan (AVAPRO) 300 MG tablet; Take 1 tablet (300 mg total) by mouth daily. -  clopidogrel (PLAVIX) 75 MG tablet; Take 1 tablet (75 mg total) by mouth daily. Generic equivalent for plavix (Needs to be seen before next refill) -     amLODipine (NORVASC) 10 MG tablet; TAKE 1 TABLET BY MOUTH DAILY. CALL OFFICE TO SCHEDULE YEARLY APPOINTMENT BEFORE ANYMORE REFILLS -     Memantine HCl ER 7 & 14 & 21 &28 MG CP24; Use as package directs -     metoprolol tartrate (LOPRESSOR) 50 MG tablet; Take 1.5 tablets (75 mg total) by mouth 2 (two) times daily.   I have discontinued Antoney L. Gustafson's metoprolol tartrate, metoprolol tartrate, metoprolol tartrate, metoprolol tartrate, and metoprolol tartrate. I have also changed his metFORMIN, irbesartan, and metoprolol tartrate. Additionally, I am having him start on Memantine HCl ER. Lastly, I am having him maintain his aspirin, potassium chloride, meclizine, blood glucose meter kit and supplies, nitroGLYCERIN, rivastigmine, clopidogrel, and amLODipine.  Meds ordered this encounter  Medications  . rivastigmine (EXELON) 9.5 mg/24hr    Sig: Place 1 patch (9.5 mg total) onto the skin daily.    Dispense:  90 patch    Refill:  1  . DISCONTD: metoprolol tartrate (LOPRESSOR) 50 MG tablet    Sig: Take 1 tablet (50 mg total) by mouth 2 (two) times daily.    Dispense:  180 tablet    Refill:  1  .  metFORMIN (GLUCOPHAGE-XR) 500 MG 24 hr tablet    Sig: Take 1 tablet (500 mg total) by mouth daily with breakfast.    Dispense:  90 tablet    Refill:  1  . irbesartan (AVAPRO) 300 MG tablet    Sig: Take 1 tablet (300 mg total) by mouth daily.    Dispense:  90 tablet    Refill:  1  . clopidogrel (PLAVIX) 75 MG tablet    Sig: Take 1 tablet (75 mg total) by mouth daily. Generic equivalent for plavix (Needs to be seen before next refill)    Dispense:  90 tablet    Refill:  1  . amLODipine (NORVASC) 10 MG tablet    Sig: TAKE 1 TABLET BY MOUTH DAILY. CALL OFFICE TO SCHEDULE YEARLY APPOINTMENT BEFORE ANYMORE REFILLS    Dispense:  90 tablet    Refill:  1  . Memantine HCl ER 7 & 14 & 21 &28 MG CP24    Sig: Use as package directs    Dispense:  60 capsule    Refill:  0  . metoprolol tartrate (LOPRESSOR) 50 MG tablet    Sig: Take 1.5 tablets (75 mg total) by mouth 2 (two) times daily.    Dispense:  270 tablet    Refill:  1     Follow-up: Return in about 3 months (around 01/20/2019).  Claretta Fraise, M.D.

## 2018-10-31 ENCOUNTER — Telehealth: Payer: Self-pay

## 2018-10-31 NOTE — Telephone Encounter (Signed)
-----   Message from Mechele ClaudeWarren Stacks, MD sent at 10/31/2018 10:08 AM EST ----- Please contact pt. Remind him he needs to come in for the labs that were ordered as part of his last exam. Thanks, WS ----- Message ----- From: SYSTEM Sent: 10/26/2018  12:08 AM EST To: Mechele ClaudeWarren Stacks, MD

## 2018-10-31 NOTE — Telephone Encounter (Signed)
Attempted to contact patient about coming in for labs- NA

## 2019-02-06 ENCOUNTER — Other Ambulatory Visit: Payer: Self-pay | Admitting: Family Medicine

## 2019-02-18 ENCOUNTER — Other Ambulatory Visit: Payer: Self-pay | Admitting: Family Medicine

## 2019-03-25 ENCOUNTER — Telehealth: Payer: Self-pay | Admitting: Family Medicine

## 2019-05-28 ENCOUNTER — Other Ambulatory Visit: Payer: Self-pay | Admitting: Family Medicine

## 2019-05-28 MED ORDER — CLOPIDOGREL BISULFATE 75 MG PO TABS: ORAL_TABLET | ORAL | 0 refills | Status: DC

## 2019-05-28 NOTE — Telephone Encounter (Signed)
Refill sent to pharmacy.   

## 2019-06-23 ENCOUNTER — Other Ambulatory Visit: Payer: Self-pay | Admitting: Family Medicine

## 2019-06-23 ENCOUNTER — Encounter: Payer: Self-pay | Admitting: Family Medicine

## 2019-06-23 ENCOUNTER — Other Ambulatory Visit: Payer: Self-pay

## 2019-06-23 ENCOUNTER — Ambulatory Visit (INDEPENDENT_AMBULATORY_CARE_PROVIDER_SITE_OTHER): Payer: Medicare Other | Admitting: Family Medicine

## 2019-06-23 VITALS — BP 117/71 | HR 75 | Temp 98.0°F | Ht 69.0 in | Wt 167.0 lb

## 2019-06-23 DIAGNOSIS — F0280 Dementia in other diseases classified elsewhere without behavioral disturbance: Secondary | ICD-10-CM

## 2019-06-23 DIAGNOSIS — I10 Essential (primary) hypertension: Secondary | ICD-10-CM

## 2019-06-23 DIAGNOSIS — D692 Other nonthrombocytopenic purpura: Secondary | ICD-10-CM

## 2019-06-23 DIAGNOSIS — Z125 Encounter for screening for malignant neoplasm of prostate: Secondary | ICD-10-CM

## 2019-06-23 DIAGNOSIS — E756 Lipid storage disorder, unspecified: Secondary | ICD-10-CM | POA: Diagnosis not present

## 2019-06-23 DIAGNOSIS — G301 Alzheimer's disease with late onset: Secondary | ICD-10-CM

## 2019-06-23 DIAGNOSIS — I482 Chronic atrial fibrillation, unspecified: Secondary | ICD-10-CM

## 2019-06-23 DIAGNOSIS — E1169 Type 2 diabetes mellitus with other specified complication: Secondary | ICD-10-CM

## 2019-06-23 DIAGNOSIS — Z0000 Encounter for general adult medical examination without abnormal findings: Secondary | ICD-10-CM | POA: Diagnosis not present

## 2019-06-23 LAB — BAYER DCA HB A1C WAIVED: HB A1C (BAYER DCA - WAIVED): 8.2 % — ABNORMAL HIGH (ref <7.0)

## 2019-06-23 MED ORDER — METFORMIN HCL ER 500 MG PO TB24: 500.0000 mg | ORAL_TABLET | Freq: Every day | ORAL | 1 refills | Status: DC

## 2019-06-23 MED ORDER — CLOPIDOGREL BISULFATE 75 MG PO TABS: ORAL_TABLET | ORAL | 1 refills | Status: DC

## 2019-06-23 MED ORDER — METOPROLOL TARTRATE 50 MG PO TABS: 50.0000 mg | ORAL_TABLET | Freq: Two times a day (BID) | ORAL | 1 refills | Status: DC

## 2019-06-23 MED ORDER — AMLODIPINE BESYLATE 10 MG PO TABS: ORAL_TABLET | ORAL | 1 refills | Status: DC

## 2019-06-23 MED ORDER — METFORMIN HCL ER 500 MG PO TB24: 500.0000 mg | ORAL_TABLET | Freq: Two times a day (BID) | ORAL | 1 refills | Status: DC

## 2019-06-23 NOTE — Patient Instructions (Signed)
Check glucose at home daily- fasting.

## 2019-06-23 NOTE — Progress Notes (Signed)
 Subjective:  Patient ID: Patrick Potts, male    DOB: 09/23/1925  Age: 85 y.o. MRN: [id number]  CC: Medical Management of Chronic Issues (6 mo ), Atrial Fibrillation, and Hypertension   HPI Patrick Potts presents for  presents for  follow-up of hypertension. Patient has no history of headache chest pain or shortness of breath or recent cough. Patient also denies symptoms of TIA such as focal numbness or weakness. Patient denies side effects from medication. States taking it regularly.   Patient in for follow-up of atrial fibrillation. Patient denies any recent bouts of chest pain or palpitations. Additionally, patient is taking Plavix and aspirin. Patient denies any recent excessive bleeding episodes including epistaxis, bleeding from the gums, genitalia, rectal bleeding or hematuria. Additionally there has been no excessive bruising.  Wife briefly holds a note up for me saying that he is getting confused. He stopped the Exelon and memantine. Wife says it is because he is stubborn. 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   Pt. Was able to tell that he is in a doctors office in [place], [place]. He did not know the day or date. He could not recall three named objects. At that point he stated he wasn't here for that and declined further MMSE.   History Patrick Potts has a past medical history of ASCVD (arteriosclerotic cardiovascular disease), Atrial fibrillation (HCC) (04/28/2014), Back pain, Essential hypertension, benign (04/28/2014), Myalgia, Myocardial infarction (HCC), and PAF (paroxysmal atrial fibrillation) (HCC).   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   His family history includes Arrhythmia in his mother; Breast cancer in his mother; Healthy in his father.He reports that he has never smoked. He has never used  smokeless tobacco. He reports that he does not drink alcohol or use drugs.    ROS Review of Systems  Constitutional: Negative.   HENT: Negative.   Eyes: Negative for visual disturbance.  Respiratory: Negative for cough and shortness of breath.   Cardiovascular: Negative for chest pain and leg swelling.  Gastrointestinal: Negative for abdominal pain, diarrhea, nausea and vomiting.  Genitourinary: Negative for difficulty urinating.  Musculoskeletal: Negative for arthralgias and myalgias.  Skin: Negative for rash.  Neurological: Negative for headaches.  Psychiatric/Behavioral: Positive for confusion (per wife). Negative for sleep disturbance.    Objective:  BP 117/71   Pulse 75   Temp 98 F (36.7 C) (Oral)   Ht 5' 9" (1.753 m)   Wt 167 lb (75.8 kg)   BMI 24.66 kg/m   BP Readings from Last 3 Encounters:  06/23/19 117/71  10/21/18 133/80  01/11/18 134/68    Wt Readings from Last 3 Encounters:  06/23/19 167 lb (75.8 kg)  10/21/18 167 lb 12.8 oz (76.1 kg)  01/11/18 166 lb (75.3 kg)     Physical Exam Vitals signs reviewed.  Constitutional:      Appearance: He is well-developed.  HENT:     Head: Normocephalic and atraumatic.     Right Ear: External ear normal.     Left Ear: External ear normal.     Mouth/Throat:     Pharynx: No oropharyngeal exudate or posterior oropharyngeal erythema.  Eyes:     Pupils: Pupils are equal, round, and reactive to light.  Neck:     Musculoskeletal: Normal range of motion and neck supple.  Cardiovascular:     Rate and Rhythm:   Normal rate and regular rhythm.     Heart sounds: No murmur.  Pulmonary:     Effort: No respiratory distress.     Breath sounds: Normal breath sounds.  Skin:    Comments: Multiple purpura on forearms   Neurological:     General: No focal deficit present.     Mental Status: He is alert.     Cranial Nerves: No cranial nerve deficit.     Coordination: Coordination normal.     Deep Tendon Reflexes: Reflexes  normal.  Psychiatric:        Mood and Affect: Mood normal.        Behavior: Behavior normal.       Assessment & Plan:   Patrick Potts was seen today for medical management of chronic issues, atrial fibrillation and hypertension.  Diagnoses and all orders for this visit:  Chronic atrial fibrillation -     CBC with Differential/Platelet  Essential hypertension, benign -     CBC with Differential/Platelet -     CMP14+EGFR -     Lipid panel  Screening for prostate cancer -     CBC with Differential/Platelet -     PSA, total and free  Healthcare maintenance -     CBC with Differential/Platelet -     Bayer DCA Hb A1c Waived -     VITAMIN D 25 Hydroxy (Vit-D Deficiency, Fractures)  Diabetic lipidosis (HCC) -     Bayer DCA Hb A1c Waived  Other orders -     metoprolol tartrate (LOPRESSOR) 50 MG tablet; Take 1 tablet (50 mg total) by mouth 2 (two) times daily. -     metFORMIN (GLUCOPHAGE-XR) 500 MG 24 hr tablet; Take 1 tablet (500 mg total) by mouth daily with breakfast. Generic equivalent for Glucophage XR -     clopidogrel (PLAVIX) 75 MG tablet; TAKE 1 TABLET BY MOUTH DAILY. GENERIC EQUIVALENT FOR FOR PLAVIX. -     amLODipine (NORVASC) 10 MG tablet; TAKE 1 TABLET BY MOUTH DAILY.       I have discontinued Kerin L. Donnell's potassium chloride, meclizine, rivastigmine, irbesartan, and Memantine HCl ER. I have also changed his metoprolol tartrate, metFORMIN, and amLODipine. Additionally, I am having him maintain his aspirin, blood glucose meter kit and supplies, nitroGLYCERIN, and clopidogrel.  Allergies as of 06/23/2019   No Known Allergies     Medication List       Accurate as of June 23, 2019  4:44 PM. If you have any questions, ask your nurse or doctor.        STOP taking these medications   irbesartan 300 MG tablet Commonly known as: AVAPRO Stopped by:  , MD   meclizine 12.5 MG tablet Commonly known as: ANTIVERT Stopped by:  , MD    Memantine HCl ER 7 & 14 & 21 &28 MG Cp24 Stopped by:  , MD   potassium chloride 10 MEQ tablet Commonly known as: K-DUR Stopped by:  , MD   rivastigmine 9.5 mg/24hr Commonly known as: EXELON Stopped by:  , MD     TAKE these medications   amLODipine 10 MG tablet Commonly known as: NORVASC TAKE 1 TABLET BY MOUTH DAILY. What changed: additional instructions Changed by:  , MD   aspirin 81 MG tablet Take 81 mg by mouth daily.   blood glucose meter kit and supplies Kit Dispense based on patient and insurance preference. Use up to four times daily as directed. (FOR ICD-9 250.00, 250.01).   clopidogrel 75 MG   tablet Commonly known as: PLAVIX TAKE 1 TABLET BY MOUTH DAILY. GENERIC EQUIVALENT FOR FOR PLAVIX.   metFORMIN 500 MG 24 hr tablet Commonly known as: GLUCOPHAGE-XR Take 1 tablet (500 mg total) by mouth daily with breakfast. Generic equivalent for Glucophage XR What changed: additional instructions Changed by:  , MD   metoprolol tartrate 50 MG tablet Commonly known as: LOPRESSOR Take 1 tablet (50 mg total) by mouth 2 (two) times daily.   nitroGLYCERIN 0.4 MG SL tablet Commonly known as: NITROSTAT ONE TAB UNDER TONGUE EVERY 5 MINS--DONT EXCEED 3 TABLETS WITHIN 15 MINUTES-CALL 911        Follow-up: Return in about 3 months (around 09/23/2019).   , M.D. 

## 2019-06-24 ENCOUNTER — Other Ambulatory Visit: Payer: Self-pay | Admitting: Family Medicine

## 2019-06-24 LAB — CBC WITH DIFFERENTIAL/PLATELET
Basophils Absolute: 0.1 10*3/uL (ref 0.0–0.2)
Basos: 1 %
EOS (ABSOLUTE): 0.3 10*3/uL (ref 0.0–0.4)
Eos: 3 %
Hematocrit: 41.5 % (ref 37.5–51.0)
Hemoglobin: 14.1 g/dL (ref 13.0–17.7)
Immature Grans (Abs): 0.1 10*3/uL (ref 0.0–0.1)
Immature Granulocytes: 1 %
Lymphocytes Absolute: 2.2 10*3/uL (ref 0.7–3.1)
Lymphs: 29 %
MCH: 31.8 pg (ref 26.6–33.0)
MCHC: 34 g/dL (ref 31.5–35.7)
MCV: 94 fL (ref 79–97)
Monocytes Absolute: 0.6 10*3/uL (ref 0.1–0.9)
Monocytes: 8 %
Neutrophils Absolute: 4.4 10*3/uL (ref 1.4–7.0)
Neutrophils: 58 %
Platelets: 191 10*3/uL (ref 150–450)
RBC: 4.43 x10E6/uL (ref 4.14–5.80)
RDW: 12.9 % (ref 11.6–15.4)
WBC: 7.6 10*3/uL (ref 3.4–10.8)

## 2019-06-24 LAB — CMP14+EGFR
ALT: 17 IU/L (ref 0–44)
AST: 20 IU/L (ref 0–40)
Albumin/Globulin Ratio: 1.9 (ref 1.2–2.2)
Albumin: 4.3 g/dL (ref 3.5–4.6)
Alkaline Phosphatase: 65 IU/L (ref 39–117)
BUN/Creatinine Ratio: 17 (ref 10–24)
BUN: 19 mg/dL (ref 10–36)
Bilirubin Total: 1.4 mg/dL — ABNORMAL HIGH (ref 0.0–1.2)
CO2: 23 mmol/L (ref 20–29)
Calcium: 9.4 mg/dL (ref 8.6–10.2)
Chloride: 99 mmol/L (ref 96–106)
Creatinine, Ser: 1.13 mg/dL (ref 0.76–1.27)
GFR calc Af Amer: 65 mL/min/{1.73_m2} (ref >59)
GFR calc non Af Amer: 57 mL/min/{1.73_m2} — ABNORMAL LOW (ref >59)
Globulin, Total: 2.3 g/dL (ref 1.5–4.5)
Glucose: 191 mg/dL — ABNORMAL HIGH (ref 65–99)
Potassium: 4.3 mmol/L (ref 3.5–5.2)
Sodium: 136 mmol/L (ref 134–144)
Total Protein: 6.6 g/dL (ref 6.0–8.5)

## 2019-06-24 LAB — LIPID PANEL
Chol/HDL Ratio: 4 ratio (ref 0.0–5.0)
Cholesterol, Total: 216 mg/dL — ABNORMAL HIGH (ref 100–199)
HDL: 54 mg/dL (ref >39)
LDL Calculated: 132 mg/dL — ABNORMAL HIGH (ref 0–99)
Triglycerides: 152 mg/dL — ABNORMAL HIGH (ref 0–149)
VLDL Cholesterol Cal: 30 mg/dL (ref 5–40)

## 2019-06-24 LAB — PSA, TOTAL AND FREE
PSA, Free Pct: 31.8 %
PSA, Free: 1.21 ng/mL
Prostate Specific Ag, Serum: 3.8 ng/mL (ref 0.0–4.0)

## 2019-06-24 LAB — VITAMIN D 25 HYDROXY (VIT D DEFICIENCY, FRACTURES): Vit D, 25-Hydroxy: 39.9 ng/mL (ref 30.0–100.0)

## 2019-06-24 MED ORDER — METFORMIN HCL ER 750 MG PO TB24: 750.0000 mg | ORAL_TABLET | Freq: Two times a day (BID) | ORAL | 1 refills | Status: DC

## 2019-06-24 MED ORDER — ATORVASTATIN CALCIUM 10 MG PO TABS: 10.0000 mg | ORAL_TABLET | Freq: Every day | ORAL | 1 refills | Status: DC

## 2019-06-25 ENCOUNTER — Other Ambulatory Visit: Payer: Self-pay | Admitting: Family Medicine

## 2019-06-27 ENCOUNTER — Encounter: Payer: Self-pay | Admitting: *Deleted

## 2019-07-03 ENCOUNTER — Other Ambulatory Visit: Payer: Self-pay | Admitting: Family Medicine

## 2019-08-13 ENCOUNTER — Telehealth: Payer: Self-pay | Admitting: Family Medicine

## 2019-08-13 ENCOUNTER — Other Ambulatory Visit: Payer: Self-pay | Admitting: Family Medicine

## 2019-08-13 NOTE — Telephone Encounter (Signed)
Is this ok to do? Please advise 

## 2019-08-14 MED ORDER — FREESTYLE LIBRE 14 DAY READER DEVI: 1.0000 | 2 refills | Status: DC

## 2019-08-14 MED ORDER — FREESTYLE LIBRE 14 DAY SENSOR MISC: 1.0000 | 2 refills | Status: DC

## 2019-08-30 ENCOUNTER — Other Ambulatory Visit: Payer: Self-pay | Admitting: Family Medicine

## 2019-09-25 ENCOUNTER — Ambulatory Visit (INDEPENDENT_AMBULATORY_CARE_PROVIDER_SITE_OTHER): Payer: Medicare Other | Admitting: Family Medicine

## 2019-09-25 DIAGNOSIS — E1169 Type 2 diabetes mellitus with other specified complication: Secondary | ICD-10-CM | POA: Diagnosis not present

## 2019-09-25 DIAGNOSIS — E756 Lipid storage disorder, unspecified: Secondary | ICD-10-CM

## 2019-09-25 DIAGNOSIS — I482 Chronic atrial fibrillation, unspecified: Secondary | ICD-10-CM | POA: Diagnosis not present

## 2019-09-25 DIAGNOSIS — I25118 Atherosclerotic heart disease of native coronary artery with other forms of angina pectoris: Secondary | ICD-10-CM | POA: Diagnosis not present

## 2019-09-25 DIAGNOSIS — G301 Alzheimer's disease with late onset: Secondary | ICD-10-CM | POA: Diagnosis not present

## 2019-09-25 DIAGNOSIS — F0280 Dementia in other diseases classified elsewhere without behavioral disturbance: Secondary | ICD-10-CM

## 2019-09-25 NOTE — Progress Notes (Signed)
Subjective:  Patient ID: Patrick Potts, male    DOB: October 20, 1925  Age: 85 y.o. MRN: [id number]  CC: No chief complaint on file.   HPI Patrick Potts presents forFollow-up of diabetes. Patient checks blood sugar at home.  Average is 112 on Libre. Drinking glucose control Boost. Patient denies symptoms such as polyuria, polydipsia, excessive hunger, nausea No significant hypoglycemic spells noted. Medications reviewed. Pt reports taking them regularly without complication/adverse reaction being reported today.   Dementia causing some confusion off and on good days and bad days.He does go to the farm daily. Driving himself, but seems it won't be long before he can't.   History Dalan has a past medical history of ASCVD (arteriosclerotic cardiovascular disease), Atrial fibrillation ([place]) (04/28/2014), Back pain, Essential hypertension, benign (04/28/2014), Myalgia, Myocardial infarction ([place]), and PAF (paroxysmal atrial fibrillation) ([place]).   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   His family history includes Arrhythmia in his mother; Breast cancer in his mother; Healthy in his father.He reports that he has never smoked. He has never used smokeless tobacco. He reports that he does not drink alcohol or use drugs.  Current Outpatient Medications on File Prior to Visit  Medication Sig Dispense Refill  . amLODipine (NORVASC) 10 MG tablet TAKE 1 TABLET BY MOUTH DAILY. 90 tablet 1  . aspirin 81 MG tablet Take 81 mg by mouth daily.    Marland Kitchen atorvastatin (LIPITOR) 10 MG tablet Take 1 tablet (10 mg total) by mouth daily. For cholesterol 90 tablet 1  . blood glucose meter kit and supplies KIT Dispense based on patient and insurance preference. Use up to four times daily as directed. (FOR ICD-9 250.00, 250.01). 1 each 0  . clopidogrel (PLAVIX) 75 MG tablet TAKE 1 TABLET BY MOUTH DAILY. GENERIC  EQUIVALENT FOR FOR PLAVIX. 30 tablet 0  . Continuous Blood Gluc Receiver (FREESTYLE LIBRE 14 DAY READER) DEVI 1 each by Does not apply route every 14 (fourteen) days. Dx E11.69 2 each 2  . Continuous Blood Gluc Sensor (FREESTYLE LIBRE 14 DAY SENSOR) MISC 1 each by Does not apply route every 14 (fourteen) days. Dx E11.69 2 each 2  . metFORMIN (GLUCOPHAGE-XR) 750 MG 24 hr tablet Take 1 tablet (750 mg total) by mouth 2 (two) times daily. Generic equivalent for Glucophage XR 90 tablet 1  . metoprolol tartrate (LOPRESSOR) 50 MG tablet Take 1 tablet (50 mg total) by mouth 2 (two) times daily. 180 tablet 1  . nitroGLYCERIN (NITROSTAT) 0.4 MG SL tablet ONE TAB UNDER TONGUE EVERY 5 MINS--DONT EXCEED 3 TABLETS WITHIN 15 MINUTES-CALL 911 25 tablet 0   No current facility-administered medications on file prior to visit.     ROS Review of Systems  Constitutional: Negative.   HENT: Negative.   Eyes: Negative for visual disturbance.  Respiratory: Negative for cough and shortness of breath.   Cardiovascular: Negative for chest pain and leg swelling.  Gastrointestinal: Negative for abdominal pain, diarrhea, nausea and vomiting.  Genitourinary: Negative for difficulty urinating.  Musculoskeletal: Negative for arthralgias and myalgias.  Skin: Negative for rash.  Neurological: Negative for headaches.  Psychiatric/Behavioral: Positive for confusion and decreased concentration. Negative for sleep disturbance.    Objective:  There were no vitals taken for this visit.  BP Readings from Last 3 Encounters:  06/23/19 117/71  10/21/18 133/80  01/11/18 134/68    Wt Readings from Last 3 Encounters:  06/23/19 167 lb (75.8 kg)  10/21/18 167 lb 12.8 oz (76.1 kg)  01/11/18 166 lb (75.3 kg)     Physical Exam  Exam deferred. Pt. Harboring due to COVID 19. Phone visit performed.   Assessment & Plan:   Diagnoses and all orders for this visit:  Diabetic lipidosis ([place])  Late onset Alzheimer's disease  without behavioral disturbance ([place])  Atherosclerosis of native coronary artery of native heart with stable angina pectoris ([place])  Chronic atrial fibrillation (HCC)  Stable overall. With time may need to add aricept, or similar.    I am having Xzavion L. Berline Lopes maintain his aspirin, blood glucose meter kit and supplies, metoprolol tartrate, amLODipine, metFORMIN, atorvastatin, FreeStyle Libre 14 Day Reader, [employer] 14 Day Sensor, clopidogrel, and nitroGLYCERIN.  Virtual Visit via telephone Note  I discussed the limitations, risks, security and privacy concerns of performing an evaluation and management service by telephone and the availability of in person appointments. I also discussed with the patient that there may be a patient responsible charge related to this service. The patient expressed understanding and agreed to proceed. Pt. Is at home. Dr. Livia Snellen is in his office.  Follow Up Instructions:   I discussed the assessment and treatment plan with the patient. The patient was provided an opportunity to ask questions and all were answered. The patient agreed with the plan and demonstrated an understanding of the instructions.   The patient was advised to call back or seek an in-person evaluation if the symptoms worsen or if the condition fails to improve as anticipated.  Total minutes including chart review and phone contact time: 30    Follow-up: Return in about 4 months (around 01/23/2020) for diabetes.  Claretta Fraise, M.D.

## 2019-09-29 ENCOUNTER — Encounter: Payer: Self-pay | Admitting: Family Medicine

## 2019-10-28 ENCOUNTER — Other Ambulatory Visit: Payer: Self-pay | Admitting: Family Medicine

## 2019-11-12 ENCOUNTER — Ambulatory Visit (INDEPENDENT_AMBULATORY_CARE_PROVIDER_SITE_OTHER): Payer: Medicare Other | Admitting: Family Medicine

## 2019-11-12 ENCOUNTER — Encounter: Payer: Self-pay | Admitting: Family Medicine

## 2019-11-12 DIAGNOSIS — H9113 Presbycusis, bilateral: Secondary | ICD-10-CM | POA: Diagnosis not present

## 2019-11-12 DIAGNOSIS — G3184 Mild cognitive impairment, so stated: Secondary | ICD-10-CM | POA: Diagnosis not present

## 2019-11-12 DIAGNOSIS — I25118 Atherosclerotic heart disease of native coronary artery with other forms of angina pectoris: Secondary | ICD-10-CM | POA: Diagnosis not present

## 2019-11-12 MED ORDER — NITROGLYCERIN 0.4 MG SL SUBL: SUBLINGUAL_TABLET | SUBLINGUAL | 5 refills | Status: AC

## 2019-11-12 MED ORDER — ISOSORBIDE MONONITRATE ER 30 MG PO TB24: 30.0000 mg | ORAL_TABLET | Freq: Every day | ORAL | 6 refills | Status: DC

## 2019-11-12 NOTE — Progress Notes (Signed)
Subjective:    Patient ID: Patrick Potts, male    DOB: 05-Apr-1925, 86 y.o.   MRN: [id number]   HPI: Patrick Potts is a 86 y.o. male presenting for three weeks of tightness in the chest everymorning. "It just hurts." Gets relief with nitroglycerin. Also gets precordial chest tightness when he walks to the mailbox. Wife states he is getting dementia. Pt. Unable to describe pain. It resolves promptly with nitroglycerin. It is unclear if there is radiation. No observable dyspnea according to his wife, who gives todays's history. Pt. 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 Wife thinks he is taking some NTG there, but he doesn't remember or communitate because of the dementia. Wife, Patrick Potts reiterates that he gets complete relief within a few minutes with SL NTG. Needs it refilled. Ran out yesterday. Pt. Has history of multiple cardiac stent placement.   Depression screen [facility] 2/9 06/23/2019 10/21/2018 10/12/2017 04/27/2017 04/17/2017  Decreased Interest 0 0 0 0 0  Down, Depressed, Hopeless 0 0 0 0 0  PHQ - 2 Score 0 0 0 0 0     Relevant past medical, surgical, family and social history reviewed and updated as indicated.  Interim medical history since our last visit reviewed. Allergies and medications reviewed and updated.  ROS:  Review of Systems  Constitutional: Negative.   HENT: Negative.   Eyes: Negative for visual disturbance.  Respiratory: Negative for cough and shortness of breath.   Cardiovascular: Positive for chest pain. Negative for leg swelling.  Gastrointestinal: Negative for abdominal pain, diarrhea, nausea and vomiting.  Genitourinary: Negative for difficulty urinating.  Musculoskeletal: Negative for arthralgias and myalgias.  Skin: Negative for rash.  Neurological: Negative for headaches.  Psychiatric/Behavioral: Negative for sleep disturbance.     Social History   Tobacco Use  Smoking Status Never Smoker  Smokeless Tobacco Never Used       Objective:     Wt Readings  from Last 3 Encounters:  06/23/19 167 lb (75.8 kg)  10/21/18 167 lb 12.8 oz (76.1 kg)  01/11/18 166 lb (75.3 kg)     Exam deferred. Pt. Harboring due to COVID 19. Phone visit performed.   Assessment & Plan:   1. Atherosclerosis of native coronary artery of native heart with stable angina pectoris (HCC)   2. Mild cognitive impairment   3. Presbycusis of both ears     Meds ordered this encounter  Medications  . isosorbide mononitrate (IMDUR) 30 MG 24 hr tablet    Sig: Take 1 tablet (30 mg total) by mouth daily.    Dispense:  30 tablet    Refill:  6  . nitroGLYCERIN (NITROSTAT) 0.4 MG SL tablet    Sig: ONE TAB UNDER TONGUE EVERY 5 MINS--DONT EXCEED 3 TABLETS WITHIN 15 MINUTES-CALL 911    Dispense:  50 tablet    Refill:  5    Pt. Is experiencing increasing frequency of angina, but able to treat with quick resolution using NTG. If not suppressed with Isosorbide. New cardiology evaluation will be indicated.At this time, due to advanced age, trial of conservative therapy is appropriate.     Diagnoses and all orders for this visit:  Atherosclerosis of native coronary artery of native heart with stable angina pectoris (HCC)  Mild cognitive impairment  Presbycusis of both ears  Other orders -     isosorbide mononitrate (IMDUR) 30 MG 24 hr tablet; Take 1 tablet (30 mg total) by mouth daily. -     nitroGLYCERIN (NITROSTAT) 0.4  MG SL tablet; ONE TAB UNDER TONGUE EVERY 5 MINS--DONT EXCEED 3 TABLETS WITHIN 15 MINUTES-CALL 911    Virtual Visit via telephone Note  I discussed the limitations, risks, security and privacy concerns of performing an evaluation and management service by telephone and the availability of in person appointments. The patient was identified with two identifiers. Pt.expressed understanding and agreed to proceed. Pt. Is at home with his wife who speaks for him as he can not hear when he tries to talk on the phhone. Dr. Livia Snellen is in his office.  Follow Up  Instructions:   I discussed the assessment and treatment plan with the patient. The patient was provided an opportunity to ask questions and all were answered. The patient agreed with the plan and demonstrated an understanding of the instructions.   The patient was advised to call back or seek an in-person evaluation if the symptoms worsen or if the condition fails to improve as anticipated.   Total minutes including chart review and phone contact time: 27   Follow up plan: Return in about 6 weeks (around 12/24/2019), or if symptoms worsen or fail to improve.  Claretta Fraise, MD [place]

## 2019-11-18 ENCOUNTER — Other Ambulatory Visit: Payer: Self-pay | Admitting: Family Medicine

## 2019-11-25 ENCOUNTER — Other Ambulatory Visit: Payer: Self-pay | Admitting: Family Medicine

## 2020-01-24 ENCOUNTER — Emergency Department (HOSPITAL_COMMUNITY): Payer: Medicare Other

## 2020-01-24 ENCOUNTER — Encounter (HOSPITAL_COMMUNITY): Payer: Self-pay | Admitting: Internal Medicine

## 2020-01-24 ENCOUNTER — Other Ambulatory Visit: Payer: Self-pay

## 2020-01-24 ENCOUNTER — Inpatient Hospital Stay (HOSPITAL_COMMUNITY)
Admission: EM | Admit: 2020-01-24 | Discharge: 2020-01-30 | DRG: 281 | Disposition: A | Discharge status: Hospice | Payer: Medicare Other | Attending: Internal Medicine | Admitting: Internal Medicine

## 2020-01-24 DIAGNOSIS — I130 Hypertensive heart and chronic kidney disease with heart failure and stage 1 through stage 4 chronic kidney disease, or unspecified chronic kidney disease: Secondary | ICD-10-CM | POA: Diagnosis not present

## 2020-01-24 DIAGNOSIS — F0281 Dementia in other diseases classified elsewhere with behavioral disturbance: Secondary | ICD-10-CM | POA: Diagnosis present

## 2020-01-24 DIAGNOSIS — Z20822 Contact with and (suspected) exposure to covid-19: Secondary | ICD-10-CM | POA: Diagnosis present

## 2020-01-24 DIAGNOSIS — E785 Hyperlipidemia, unspecified: Secondary | ICD-10-CM | POA: Diagnosis not present

## 2020-01-24 DIAGNOSIS — R441 Visual hallucinations: Secondary | ICD-10-CM | POA: Diagnosis present

## 2020-01-24 DIAGNOSIS — E7800 Pure hypercholesterolemia, unspecified: Secondary | ICD-10-CM | POA: Diagnosis not present

## 2020-01-24 DIAGNOSIS — N179 Acute kidney failure, unspecified: Secondary | ICD-10-CM | POA: Diagnosis not present

## 2020-01-24 DIAGNOSIS — Z66 Do not resuscitate: Secondary | ICD-10-CM | POA: Diagnosis present

## 2020-01-24 DIAGNOSIS — R17 Unspecified jaundice: Secondary | ICD-10-CM | POA: Diagnosis not present

## 2020-01-24 DIAGNOSIS — I208 Other forms of angina pectoris: Secondary | ICD-10-CM | POA: Diagnosis present

## 2020-01-24 DIAGNOSIS — N181 Chronic kidney disease, stage 1: Secondary | ICD-10-CM | POA: Diagnosis not present

## 2020-01-24 DIAGNOSIS — Z803 Family history of malignant neoplasm of breast: Secondary | ICD-10-CM

## 2020-01-24 DIAGNOSIS — I429 Cardiomyopathy, unspecified: Secondary | ICD-10-CM | POA: Diagnosis present

## 2020-01-24 DIAGNOSIS — F419 Anxiety disorder, unspecified: Secondary | ICD-10-CM | POA: Diagnosis not present

## 2020-01-24 DIAGNOSIS — R443 Hallucinations, unspecified: Secondary | ICD-10-CM | POA: Diagnosis present

## 2020-01-24 DIAGNOSIS — I482 Chronic atrial fibrillation, unspecified: Secondary | ICD-10-CM | POA: Diagnosis present

## 2020-01-24 DIAGNOSIS — I350 Nonrheumatic aortic (valve) stenosis: Secondary | ICD-10-CM

## 2020-01-24 DIAGNOSIS — G309 Alzheimer's disease, unspecified: Secondary | ICD-10-CM

## 2020-01-24 DIAGNOSIS — Z515 Encounter for palliative care: Secondary | ICD-10-CM

## 2020-01-24 DIAGNOSIS — Z7902 Long term (current) use of antithrombotics/antiplatelets: Secondary | ICD-10-CM

## 2020-01-24 DIAGNOSIS — D696 Thrombocytopenia, unspecified: Secondary | ICD-10-CM | POA: Diagnosis not present

## 2020-01-24 DIAGNOSIS — I5022 Chronic systolic (congestive) heart failure: Secondary | ICD-10-CM

## 2020-01-24 DIAGNOSIS — R778 Other specified abnormalities of plasma proteins: Secondary | ICD-10-CM

## 2020-01-24 DIAGNOSIS — Z955 Presence of coronary angioplasty implant and graft: Secondary | ICD-10-CM

## 2020-01-24 DIAGNOSIS — F05 Delirium due to known physiological condition: Secondary | ICD-10-CM | POA: Diagnosis not present

## 2020-01-24 DIAGNOSIS — R079 Chest pain, unspecified: Secondary | ICD-10-CM | POA: Diagnosis present

## 2020-01-24 DIAGNOSIS — I480 Paroxysmal atrial fibrillation: Secondary | ICD-10-CM | POA: Diagnosis not present

## 2020-01-24 DIAGNOSIS — I214 Non-ST elevation (NSTEMI) myocardial infarction: Principal | ICD-10-CM | POA: Diagnosis present

## 2020-01-24 DIAGNOSIS — E119 Type 2 diabetes mellitus without complications: Secondary | ICD-10-CM

## 2020-01-24 DIAGNOSIS — Z7984 Long term (current) use of oral hypoglycemic drugs: Secondary | ICD-10-CM

## 2020-01-24 DIAGNOSIS — E1122 Type 2 diabetes mellitus with diabetic chronic kidney disease: Secondary | ICD-10-CM | POA: Diagnosis not present

## 2020-01-24 DIAGNOSIS — Z7189 Other specified counseling: Secondary | ICD-10-CM

## 2020-01-24 DIAGNOSIS — Z79899 Other long term (current) drug therapy: Secondary | ICD-10-CM

## 2020-01-24 DIAGNOSIS — I2510 Atherosclerotic heart disease of native coronary artery without angina pectoris: Secondary | ICD-10-CM | POA: Diagnosis not present

## 2020-01-24 DIAGNOSIS — F0280 Dementia in other diseases classified elsewhere without behavioral disturbance: Secondary | ICD-10-CM | POA: Diagnosis present

## 2020-01-24 DIAGNOSIS — R442 Other hallucinations: Secondary | ICD-10-CM | POA: Diagnosis not present

## 2020-01-24 DIAGNOSIS — Z9181 History of falling: Secondary | ICD-10-CM

## 2020-01-24 DIAGNOSIS — I2583 Coronary atherosclerosis due to lipid rich plaque: Secondary | ICD-10-CM

## 2020-01-24 DIAGNOSIS — Z7982 Long term (current) use of aspirin: Secondary | ICD-10-CM

## 2020-01-24 DIAGNOSIS — I252 Old myocardial infarction: Secondary | ICD-10-CM

## 2020-01-24 HISTORY — DX: Chronic atrial fibrillation, unspecified: I48.20

## 2020-01-24 HISTORY — DX: Dementia in other diseases classified elsewhere, unspecified severity, without behavioral disturbance, psychotic disturbance, mood disturbance, and anxiety: F02.80

## 2020-01-24 HISTORY — DX: Hyperlipidemia, unspecified: E78.5

## 2020-01-24 HISTORY — DX: Type 2 diabetes mellitus without complications: E11.9

## 2020-01-24 LAB — CBC WITH DIFFERENTIAL/PLATELET
Abs Immature Granulocytes: 0.02 10*3/uL (ref 0.00–0.07)
Basophils Absolute: 0 10*3/uL (ref 0.0–0.1)
Basophils Relative: 0 %
Eosinophils Absolute: 0 10*3/uL (ref 0.0–0.5)
Eosinophils Relative: 0 %
HCT: 41 % (ref 39.0–52.0)
Hemoglobin: 13.2 g/dL (ref 13.0–17.0)
Immature Granulocytes: 0 %
Lymphocytes Relative: 13 %
Lymphs Abs: 1 10*3/uL (ref 0.7–4.0)
MCH: 31.5 pg (ref 26.0–34.0)
MCHC: 32.2 g/dL (ref 30.0–36.0)
MCV: 97.9 fL (ref 80.0–100.0)
Monocytes Absolute: 0.6 10*3/uL (ref 0.1–1.0)
Monocytes Relative: 8 %
Neutro Abs: 6.5 10*3/uL (ref 1.7–7.7)
Neutrophils Relative %: 79 %
Platelets: 149 10*3/uL — ABNORMAL LOW (ref 150–400)
RBC: 4.19 MIL/uL — ABNORMAL LOW (ref 4.22–5.81)
RDW: 14.1 % (ref 11.5–15.5)
WBC: 8.3 10*3/uL (ref 4.0–10.5)
nRBC: 0 % (ref 0.0–0.2)

## 2020-01-24 LAB — COMPREHENSIVE METABOLIC PANEL
ALT: 32 U/L (ref 0–44)
AST: 42 U/L — ABNORMAL HIGH (ref 15–41)
Albumin: 4.3 g/dL (ref 3.5–5.0)
Alkaline Phosphatase: 72 U/L (ref 38–126)
Anion gap: 13 (ref 5–15)
BUN: 39 mg/dL — ABNORMAL HIGH (ref 8–23)
CO2: 21 mmol/L — ABNORMAL LOW (ref 22–32)
Calcium: 9.8 mg/dL (ref 8.9–10.3)
Chloride: 107 mmol/L (ref 98–111)
Creatinine, Ser: 1.24 mg/dL (ref 0.61–1.24)
GFR calc Af Amer: 58 mL/min — ABNORMAL LOW (ref >60)
GFR calc non Af Amer: 50 mL/min — ABNORMAL LOW (ref >60)
Glucose, Bld: 201 mg/dL — ABNORMAL HIGH (ref 70–99)
Potassium: 4.1 mmol/L (ref 3.5–5.1)
Sodium: 141 mmol/L (ref 135–145)
Total Bilirubin: 2.7 mg/dL — ABNORMAL HIGH (ref 0.3–1.2)
Total Protein: 7.2 g/dL (ref 6.5–8.1)

## 2020-01-24 LAB — URINALYSIS, ROUTINE W REFLEX MICROSCOPIC
Bilirubin Urine: NEGATIVE
Glucose, UA: NEGATIVE mg/dL
Hgb urine dipstick: NEGATIVE
Ketones, ur: NEGATIVE mg/dL
Leukocytes,Ua: NEGATIVE
Nitrite: NEGATIVE
Protein, ur: 100 mg/dL — AB
Specific Gravity, Urine: 1.027 (ref 1.005–1.030)
pH: 5 (ref 5.0–8.0)

## 2020-01-24 LAB — MAGNESIUM: Magnesium: 1.6 mg/dL — ABNORMAL LOW (ref 1.7–2.4)

## 2020-01-24 LAB — TROPONIN I (HIGH SENSITIVITY)
Troponin I (High Sensitivity): 149 ng/L (ref <18)
Troponin I (High Sensitivity): 141 ng/L (ref <18)

## 2020-01-24 MED ORDER — ASPIRIN EC 81 MG PO TBEC
81.0000 mg | DELAYED_RELEASE_TABLET | Freq: Every day | ORAL | Status: DC
  Administered 2020-01-25 – 2020-01-29 (×5): 81 mg via ORAL
  Filled 2020-01-24 (×5): qty 1

## 2020-01-24 MED ORDER — ATORVASTATIN CALCIUM 10 MG PO TABS
10.0000 mg | ORAL_TABLET | Freq: Every day | ORAL | Status: DC
  Administered 2020-01-25: 10 mg via ORAL
  Filled 2020-01-24: qty 1

## 2020-01-24 MED ORDER — METOPROLOL TARTRATE 50 MG PO TABS
50.0000 mg | ORAL_TABLET | Freq: Two times a day (BID) | ORAL | Status: AC
  Administered 2020-01-24 – 2020-01-26 (×5): 50 mg via ORAL
  Filled 2020-01-24 (×4): qty 1
  Filled 2020-01-24: qty 2

## 2020-01-24 MED ORDER — ISOSORBIDE MONONITRATE ER 30 MG PO TB24
30.0000 mg | ORAL_TABLET | Freq: Every day | ORAL | Status: DC
  Administered 2020-01-25 – 2020-01-29 (×5): 30 mg via ORAL
  Filled 2020-01-24 (×5): qty 1

## 2020-01-24 MED ORDER — CLOPIDOGREL BISULFATE 75 MG PO TABS
75.0000 mg | ORAL_TABLET | Freq: Every day | ORAL | Status: DC
  Administered 2020-01-25 – 2020-01-29 (×5): 75 mg via ORAL
  Filled 2020-01-24 (×5): qty 1

## 2020-01-24 MED ORDER — ACETAMINOPHEN 325 MG PO TABS: 650.0000 mg | ORAL_TABLET | ORAL | Status: DC | PRN

## 2020-01-24 MED ORDER — SODIUM CHLORIDE 0.9 % IV BOLUS
500.0000 mL | Freq: Once | INTRAVENOUS | Status: AC
  Administered 2020-01-24: 500 mL via INTRAVENOUS

## 2020-01-24 MED ORDER — ONDANSETRON HCL 4 MG/2ML IJ SOLN
4.0000 mg | Freq: Four times a day (QID) | INTRAMUSCULAR | Status: DC | PRN
  Filled 2020-01-24: qty 2

## 2020-01-24 MED ORDER — HEPARIN (PORCINE) 25000 UT/250ML-% IV SOLN
1000.0000 [IU]/h | INTRAVENOUS | Status: DC
  Administered 2020-01-24: 1000 [IU]/h via INTRAVENOUS
  Filled 2020-01-24: qty 250

## 2020-01-24 MED ORDER — NITROGLYCERIN 0.4 MG SL SUBL: 0.4000 mg | SUBLINGUAL_TABLET | SUBLINGUAL | Status: DC | PRN

## 2020-01-24 MED ORDER — HEPARIN (PORCINE) 25000 UT/250ML-% IV SOLN
1000.0000 [IU]/h | INTRAVENOUS | Status: DC
  Administered 2020-01-25: 850 [IU]/h via INTRAVENOUS
  Administered 2020-01-26: 1000 [IU]/h via INTRAVENOUS
  Filled 2020-01-24 (×2): qty 250

## 2020-01-24 MED ORDER — HEPARIN BOLUS VIA INFUSION
4000.0000 [IU] | Freq: Once | INTRAVENOUS | Status: AC
  Administered 2020-01-24: 4000 [IU] via INTRAVENOUS

## 2020-01-24 NOTE — ED Triage Notes (Addendum)
Family noted that last night that pt seemed altered and was hallucinating last night. Family concerned he may have a UTI. No aggressive behavior. Denies pain.  Pt oriented to person and place and DOB

## 2020-01-24 NOTE — Progress Notes (Signed)
ANTICOAGULATION CONSULT NOTE - Initial Consult  Pharmacy Consult for heparin gtt  Indication: chest pain/ACS  No Known Allergies  Patient Measurements: Height: 5\' 9"  (175.3 cm) Weight: 154 lb 5.2 oz (70 kg) IBW/kg (Calculated) : 70.7 Heparin Dosing Weight: HEPARIN DW (KG): 70   Vital Signs: Temp: 97.5 F (36.4 C) (03/20 1444) Temp Source: Oral (03/20 1444) BP: 110/78 (03/20 1707) Pulse Rate: 69 (03/20 1615)  Labs: Recent Labs    01/24/20 1535  HGB 13.2  HCT 41.0  PLT 149*  CREATININE 1.24    Estimated Creatinine Clearance: 37.6 mL/min (by C-G formula based on SCr of 1.24 mg/dL).   Medical History: Past Medical History:  Diagnosis Date  . ASCVD (arteriosclerotic cardiovascular disease)    multivessel, s/p PCI/BM stent implant, ostial RCA, 04/30/06---s/p PCI/Rotablator-assisted DE stent implant LAD diagonal 06/12/06----known occlusion LCx OM small vessel--Intact overall LVEF 55-60% on cath 05/03/06  . Atrial fibrillation (HCC) 04/28/2014  . Back pain    remote history  . Essential hypertension, benign 04/28/2014  . Myalgia    from statins  . Myocardial infarction (HCC)   . PAF (paroxysmal atrial fibrillation) (HCC)    becoming chronic 04/30/2014 score 2     Medications:  (Not in a hospital admission)  Scheduled:   Infusions:  . heparin     PRN: nitroGLYCERIN Anti-infectives (From admission, onward)   None      Assessment: [place] a 86 y.o. male requires anticoagulation with a heparin iv infusion for the indication of  chest pain/ACS. Heparin gtt will be started following pharmacy protocol per pharmacy consult. Patient is not on previous oral anticoagulant that will require aPTT/HL correlation before transitioning to only HL monitoring.   Goal of Therapy:  Heparin level 0.3-0.7 units/ml Monitor platelets by anticoagulation protocol: Yes   Plan:  Give 4000 already given units bolus x 1 Start heparin infusion at 850 units/hr Check anti-Xa level in 8  hours and daily while on heparin Continue to monitor H&H and platelets  Heparin level to be drawn in 8 hours for patients >87 years old or crcl < 19ml/min  31m Genavie Boettger 01/24/2020,7:48 PM

## 2020-01-24 NOTE — ED Notes (Signed)
Date and time results received: 01/24/20  1630 (use smartphrase ".now" to insert current time)  Test: Trop Critical Value: 149  Name of Provider Notified: [place]  Orders Received? Or Actions Taken?: Orders Received - See Orders for details

## 2020-01-24 NOTE — ED Provider Notes (Signed)
[facility] EMERGENCY DEPARTMENT Provider Note   CSN: [id number] Arrival date & time: 01/24/20  1435     History Chief Complaint  Patient presents with  . Altered Mental Status    Patrick Potts is a 86 y.o. male.  Patient complains of occasional chest discomfort for the last 2 weeks.  Patient has a history of 2 stents placed in 07.  According to his son he has had some hallucinations recently but seems to be pretty much back to his normal  The history is provided by the patient. No language interpreter was used.  Chest Pain Pain location:  L chest Pain quality: aching   Pain radiates to:  Does not radiate Pain severity:  Mild Onset quality:  Sudden Timing:  Intermittent Progression:  Waxing and waning Chronicity:  Recurrent Context: not breathing   Relieved by:  Nothing Associated symptoms: no abdominal pain, no back pain, no cough, no fatigue and no headache        Past Medical History:  Diagnosis Date  . ASCVD (arteriosclerotic cardiovascular disease)    multivessel, s/p PCI/BM stent implant, ostial RCA, 04/30/06---s/p PCI/Rotablator-assisted DE stent implant LAD diagonal 06/12/06----known occlusion LCx OM small vessel--Intact overall LVEF 55-60% on cath 05/03/06  . Atrial fibrillation ([place]) 04/28/2014  . Back pain    remote history  . Essential hypertension, benign 04/28/2014  . Myalgia    from statins  . Myocardial infarction ([place])   . PAF (paroxysmal atrial fibrillation) (HCC)    becoming chronic [place] score 2     Patient Active Problem List   Diagnosis Date Noted  . Chest pain 01/24/2020  . Late onset Alzheimer's disease without behavioral disturbance ([place]) 01/13/2018  . Senile purpura ([place]) 05/28/2017  . Ear lesion 07/16/2015  . Diabetic lipidosis ([place]) 07/14/2015  . Arteriosclerosis of coronary artery 05/28/2014  . Atrial fibrillation ([place]) 04/28/2014  . 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 Essential hypertension, benign 04/28/2014    Past Surgical History:  Procedure Laterality Date  . CARDIAC CATHETERIZATION  05/03/06  . PERCUTANEOUS CORONARY ROTOBLATOR INTERVENTION (PCI-R)  05/03/06   multivessel, s/p PCI/BM stent implant, ostial RCA, 04/30/06---s/p PCI/Rotablator-assisted DE stent implant LAD diagonal 06/12/06----known occlusion LCx OM small vessel--Intact overall LVEF 55-60% on cath 05/03/06       Family History  Problem Relation Age of Onset  . Arrhythmia Mother   . Breast cancer Mother   . Healthy Father     Social History   Tobacco Use  . Smoking status: Never Smoker  . Smokeless tobacco: Never Used  Substance Use Topics  . Alcohol use: No  . Drug use: No    Home Medications Prior to Admission medications   Medication Sig Start Date End Date Taking? Authorizing Provider  amLODipine (NORVASC) 10 MG tablet TAKE 1 TABLET BY MOUTH DAILY. 06/23/19   Claretta Fraise, MD  aspirin 81 MG tablet Take 81 mg by mouth daily.    [provider]  atorvastatin (LIPITOR) 10 MG tablet Take 1 tablet (10 mg total) by mouth daily. For cholesterol 06/24/19   Claretta Fraise, MD  blood glucose meter kit and supplies KIT Dispense based on patient and insurance preference. Use up to four times daily as directed. (FOR ICD-9 250.00, 250.01). 04/27/17   Claretta Fraise, MD  clopidogrel (PLAVIX) 75 MG tablet TAKE 1 TABLET BY MOUTH DAILY. GENERIC EQUIVALENT FOR FOR PLAVIX. 11/25/19   Claretta Fraise, MD  Continuous Blood Gluc  Receiver (FREESTYLE LIBRE 14 DAY READER) DEVI 1 each by Does not apply route every 14 (fourteen) days. Dx E11.69 08/14/19   Claretta Fraise, MD  Continuous Blood Gluc Sensor (FREESTYLE LIBRE 14 DAY SENSOR) MISC USE AS DIRECTED; Dx E11.69 11/19/19   Claretta Fraise, MD  isosorbide mononitrate (IMDUR) 30 MG 24 hr tablet Take 1 tablet (30 mg total) by mouth daily. 11/12/19   Claretta Fraise, MD  metFORMIN (GLUCOPHAGE-XR) 750 MG 24 hr tablet Take 1 tablet (750 mg total) by  mouth 2 (two) times daily. Generic equivalent for Glucophage XR 06/24/19   Claretta Fraise, MD  metoprolol tartrate (LOPRESSOR) 50 MG tablet Take 1 tablet (50 mg total) by mouth 2 (two) times daily. 06/23/19   Claretta Fraise, MD  nitroGLYCERIN (NITROSTAT) 0.4 MG SL tablet ONE TAB UNDER TONGUE EVERY 5 MINS--DONT EXCEED 3 TABLETS WITHIN 15 MINUTES-CALL 911 11/12/19   Claretta Fraise, MD    Allergies    Patient has no known allergies.  Review of Systems   Review of Systems  Constitutional: Negative for appetite change and fatigue.  HENT: Negative for congestion, ear discharge and sinus pressure.   Eyes: Negative for discharge.  Respiratory: Negative for cough.   Cardiovascular: Positive for chest pain.  Gastrointestinal: Negative for abdominal pain and diarrhea.  Genitourinary: Negative for frequency and hematuria.  Musculoskeletal: Negative for back pain.  Skin: Negative for rash.  Neurological: Negative for seizures and headaches.  Psychiatric/Behavioral: Negative for hallucinations.    Physical Exam Updated Vital Signs BP 110/78   Pulse 69   Temp (!) 97.5 F (36.4 C) (Oral)   Resp 19   Ht '5\' 9"'$  (1.753 m)   Wt 70 kg   SpO2 100%   BMI 22.79 kg/m   Physical Exam Vitals and nursing note reviewed.  Constitutional:      Appearance: He is well-developed.  HENT:     Head: Normocephalic.     Nose: Nose normal.  Eyes:     General: No scleral icterus.    Conjunctiva/sclera: Conjunctivae normal.  Neck:     Thyroid: No thyromegaly.  Cardiovascular:     Rate and Rhythm: Normal rate and regular rhythm.     Heart sounds: No murmur. No friction rub. No gallop.   Pulmonary:     Breath sounds: No stridor. No wheezing or rales.  Chest:     Chest wall: No tenderness.  Abdominal:     General: There is no distension.     Tenderness: There is no abdominal tenderness. There is no rebound.  Musculoskeletal:        General: Normal range of motion.     Cervical back: Neck supple.    Lymphadenopathy:     Cervical: No cervical adenopathy.  Skin:    Findings: No erythema or rash.  Neurological:     Mental Status: He is alert and oriented to person, place, and time.     Motor: No abnormal muscle tone.     Coordination: Coordination normal.  Psychiatric:        Behavior: Behavior normal.     ED Results / Procedures / Treatments   Labs (all labs ordered are listed, but only abnormal results are displayed) Labs Reviewed  URINALYSIS, ROUTINE W REFLEX MICROSCOPIC - Abnormal; Notable for the following components:      Result Value   Color, Urine AMBER (*)    APPearance HAZY (*)    Protein, ur 100 (*)    Bacteria, UA RARE (*)  All other components within normal limits  CBC WITH DIFFERENTIAL/PLATELET - Abnormal; Notable for the following components:   RBC 4.19 (*)    Platelets 149 (*)    All other components within normal limits  COMPREHENSIVE METABOLIC PANEL - Abnormal; Notable for the following components:   CO2 21 (*)    Glucose, Bld 201 (*)    BUN 39 (*)    AST 42 (*)    Total Bilirubin 2.7 (*)    GFR calc non Af Amer 50 (*)    GFR calc Af Amer 58 (*)    All other components within normal limits  TROPONIN I (HIGH SENSITIVITY) - Abnormal; Notable for the following components:   Troponin I (High Sensitivity) 149 (*)    All other components within normal limits  TROPONIN I (HIGH SENSITIVITY) - Abnormal; Notable for the following components:   Troponin I (High Sensitivity) 141 (*)    All other components within normal limits  SARS CORONAVIRUS 2 (TAT 6-24 HRS)  MAGNESIUM    EKG EKG Interpretation  Date/Time:  Saturday January 24 2020 15:56:10 EDT Ventricular Rate:  78 PR Interval:    QRS Duration: 146 QT Interval:  492 QTC Calculation: 561 R Axis:   -9 Text Interpretation: Atrial fibrillation Right bundle branch block Anterior infarct, age indeterminate Baseline wander in lead(s) V4 Confirmed by Milton Ferguson [phone]) on 01/24/2020 4:37:09  PM   Radiology CT Head Wo Contrast  Result Date: 01/24/2020 CLINICAL DATA:  Ataxia.  Hallucinations. EXAM: CT HEAD WITHOUT CONTRAST TECHNIQUE: Contiguous axial images were obtained from the base of the skull through the vertex without intravenous contrast. COMPARISON:  Mar 08, 2017. FINDINGS: Brain: No evidence of acute infarction, hemorrhage, hydrocephalus, extra-axial collection or mass lesion/mass effect. Advanced atrophy and chronic microvascular ischemic changes are noted. There are left-sided basal ganglia lacunar infarcts. Vascular: No hyperdense vessel or unexpected calcification. Skull: Normal. Negative for fracture or focal lesion. Sinuses/Orbits: There mucosal retention cysts involving the bilateral maxillary sinuses. There is some mucosal thickening involving the bilateral ethmoid air cells. The remaining paranasal sinuses and mastoid air cells are essentially clear. Other: None. IMPRESSION: No acute intracranial abnormality. Atrophy and advanced chronic microvascular ischemic changes are noted. Electronically Signed   By: Constance Holster M.D.   On: 01/24/2020 16:58   DG Chest Port 1 View  Result Date: 01/24/2020 CLINICAL DATA:  Weakness. EXAM: PORTABLE CHEST 1 VIEW COMPARISON:  January 16, 2010 FINDINGS: Heart size is significantly enlarged. Aortic calcifications are noted. There is mild vascular congestion without overt pulmonary edema. The lungs appear to be slightly hyperexpanded. There is some blunting of the costophrenic angles bilaterally. The right paratracheal stripe appears thickened which is likely secondary to vascular structures in this region as seen on the patient's prior CT neck. IMPRESSION: Cardiomegaly with mild vascular congestion. Electronically Signed   By: Constance Holster M.D.   On: 01/24/2020 15:47    Procedures Procedures (including critical care time)  Medications Ordered in ED Medications  heparin ADULT infusion 100 units/mL (25000 units/287m sodium  chloride 0.45%) (1,000 Units/hr Intravenous New Bag/Given 01/24/20 1909)  nitroGLYCERIN (NITROSTAT) SL tablet 0.4 mg (has no administration in time range)  sodium chloride 0.9 % bolus 500 mL (0 mLs Intravenous Stopped 01/24/20 1910)  heparin bolus via infusion 4,000 Units (4,000 Units Intravenous Bolus from Bag 01/24/20 1910)    ED Course  I have reviewed the triage vital signs and the nursing notes.  Pertinent labs & imaging results that were available during  my care of the patient were reviewed by me and considered in my medical decision making (see chart for details).    CRITICAL CARE Performed by: Milton Ferguson Total critical care time: 35 minutes Critical care time was exclusive of separately billable procedures and treating other patients. Critical care was necessary to treat or prevent imminent or life-threatening deterioration. Critical care was time spent personally by me on the following activities: development of treatment plan with patient and/or surrogate as well as nursing, discussions with consultants, evaluation of patient's response to treatment, examination of patient, obtaining history from patient or surrogate, ordering and performing treatments and interventions, ordering and review of laboratory studies, ordering and review of radiographic studies, pulse oximetry and re-evaluation of patient's condition.  MDM Rules/Calculators/A&P                     Patient has mildly elevated troponins.  I spoke with cardiology and it was decided to start the patient on heparin and admit him over at [facility] by the hospitalist with a cardiology consult Final Clinical Impression(s) / ED Diagnoses Final diagnoses:  Angina at rest [facility])    Rx / DC Orders ED Discharge Orders    None       Milton Ferguson, MD 01/24/20 1945

## 2020-01-24 NOTE — H&P (Addendum)
History and Physical    [name] OEV:[id number] DOB: 1925-07-04 DOA: 01/24/2020  PCP: Claretta Fraise, MD   Patient coming from: Home  I have personally briefly reviewed patient's old medical records in [place]  Chief Complaint: Hallucinations, chest pain  HPI: Patrick Potts is a 86 y.o. male with medical history significant for paroxysmal atrial fibrillation, myocardial infarction, hypertension, Alzheimer's.  Patient was brought to the ED with complaints of chest pain ongoing for several days, he is unable to tell me exactly how long but he tells me for "a while now".  Chest pain is in the middle of his chest and slightly towards the left.  His pain is worse with activity or when he is doing anything strenuous or lifting something heavy.  Chest pain last as long as it takes for him to take his nitroglycerin.  Nitroglycerin resolves his chest pain.  He denies associated difficulty breathing with chest pain.  He reports similar chest pain several years ago when he had his cardiac cath and stent placement.  He is on aspirin and Plavix and he believes he has been taking this every day.   Patient lives alone with his spouse.  He thought he saw someone sitting on a chair who actually was not there.  Son is at bedside, patient has baseline dementia-appears mild.  On further questioning patient is not actually sure of what happened at the time.  Patient is awake alert oriented and able to give me history.  He ambulates without assistance or assistive device.  He still drives.  ED Course: Stable vitals.  Hs troponin 149 > 141, EKG showing atrial fibrillation, unchanged from prior.  QTC 561.  Unremarkable CBC, BMP.  Portable chest x-ray cardiomegaly with mild vascular congestion.  Head CT unremarkable. EDP talked to cardiology on-call Dr. Radford Pax, recommended admission to [facility], start heparin drip, will see in a.m.  Review of Systems: As per HPI all other systems reviewed and  negative.  Past Medical History:  Diagnosis Date  . ASCVD (arteriosclerotic cardiovascular disease)    multivessel, s/p PCI/BM stent implant, ostial RCA, 04/30/06---s/p PCI/Rotablator-assisted DE stent implant LAD diagonal 06/12/06----known occlusion LCx OM small vessel--Intact overall LVEF 55-60% on cath 05/03/06  . Atrial fibrillation ([place]) 04/28/2014  . Back pain    remote history  . Essential hypertension, benign 04/28/2014  . Myalgia    from statins  . Myocardial infarction ([place])   . PAF (paroxysmal atrial fibrillation) (H[facility])    becoming chronic [place] score 2     Past Surgical History:  Procedure Laterality Date  . CARDIAC CATHETERIZATION  05/03/06  . PERCUTANEOUS CORONARY ROTOBLATOR INTERVENTION (PCI-R)  05/03/06   multivessel, s/p PCI/BM stent implant, ostial RCA, 04/30/06---s/p PCI/Rotablator-assisted DE stent implant LAD diagonal 06/12/06----known occlusion LCx OM small vessel--Intact overall LVEF 55-60% on cath 05/03/06     reports that he has never smoked. He has never used smokeless tobacco. He reports that he does not drink alcohol or use drugs.  No Known Allergies  Family History  Problem Relation Age of Onset  . Arrhythmia Mother   . Breast cancer Mother   . Healthy Father     Prior to Admission medications   Medication Sig Start Date End Date Taking? Authorizing Provider  amLODipine (NORVASC) 10 MG tablet TAKE 1 TABLET BY MOUTH DAILY. 06/23/19   Claretta Fraise, MD  aspirin 81 MG tablet Take 81 mg by mouth daily.    [provider]  atorvastatin (LIPITOR) 10  MG tablet Take 1 tablet (10 mg total) by mouth daily. For cholesterol 06/24/19   Claretta Fraise, MD  blood glucose meter kit and supplies KIT Dispense based on patient and insurance preference. Use up to four times daily as directed. (FOR ICD-9 250.00, 250.01). 04/27/17   Claretta Fraise, MD  clopidogrel (PLAVIX) 75 MG tablet TAKE 1 TABLET BY MOUTH DAILY. GENERIC EQUIVALENT FOR FOR PLAVIX. 11/25/19   Claretta Fraise,  MD  Continuous Blood Gluc Receiver (FREESTYLE LIBRE 14 DAY READER) DEVI 1 each by Does not apply route every 14 (fourteen) days. Dx E11.69 08/14/19   Claretta Fraise, MD  Continuous Blood Gluc Sensor (FREESTYLE LIBRE 14 DAY SENSOR) MISC USE AS DIRECTED; Dx E11.69 11/19/19   Claretta Fraise, MD  isosorbide mononitrate (IMDUR) 30 MG 24 hr tablet Take 1 tablet (30 mg total) by mouth daily. 11/12/19   Claretta Fraise, MD  metFORMIN (GLUCOPHAGE-XR) 750 MG 24 hr tablet Take 1 tablet (750 mg total) by mouth 2 (two) times daily. Generic equivalent for Glucophage XR 06/24/19   Claretta Fraise, MD  metoprolol tartrate (LOPRESSOR) 50 MG tablet Take 1 tablet (50 mg total) by mouth 2 (two) times daily. 06/23/19   Claretta Fraise, MD  nitroGLYCERIN (NITROSTAT) 0.4 MG SL tablet ONE TAB UNDER TONGUE EVERY 5 MINS--DONT EXCEED 3 TABLETS WITHIN 15 MINUTES-CALL 911 11/12/19   Claretta Fraise, MD    Physical Exam: Vitals:   01/24/20 1530 01/24/20 1615 01/24/20 1700 01/24/20 1707  BP: 107/82   110/78  Pulse:  69    Resp:  (!) 21 19   Temp:      TempSrc:      SpO2:  100%    Weight:      Height:        Constitutional: NAD, calm, comfortable Vitals:   01/24/20 1530 01/24/20 1615 01/24/20 1700 01/24/20 1707  BP: 107/82   110/78  Pulse:  69    Resp:  (!) 21 19   Temp:      TempSrc:      SpO2:  100%    Weight:      Height:       Eyes: PERRL, lids and conjunctivae normal ENMT: Mucous membranes are moist. Posterior pharynx clear of any exudate or lesions.Normal dentition.  Neck: normal, supple, no masses, no thyromegaly Respiratory:  Normal respiratory effort. No accessory muscle use.  Cardiovascular: IRRegular rate and rhythm,No extremity edema. 2+ pedal pulses.  Abdomen: no tenderness, no masses palpated. No hepatosplenomegaly. Bowel sounds positive.  Musculoskeletal: no clubbing / cyanosis. No joint deformity upper and lower extremities. Good ROM, no contractures. Skin: no rashes, lesions, ulcers. No induration  Neurologic: No apparent cranial nerve abnormality, 4+5 strength in all extremities. Psychiatric: Normal judgment and insight. Alert and oriented x 3. Normal mood.   Labs on Admission: I have personally reviewed following labs and imaging studies  CBC: Recent Labs  Lab 01/24/20 1535  WBC 8.3  NEUTROABS 6.5  HGB 13.2  HCT 41.0  MCV 97.9  PLT 283*   Basic Metabolic Panel: Recent Labs  Lab 01/24/20 1535  NA 141  K 4.1  CL 107  CO2 21*  GLUCOSE 201*  BUN 39*  CREATININE 1.24  CALCIUM 9.8   Liver Function Tests: Recent Labs  Lab 01/24/20 1535  AST 42*  ALT 32  ALKPHOS 72  BILITOT 2.7*  PROT 7.2  ALBUMIN 4.3   Urine analysis:    Component Value Date/Time   COLORURINE AMBER (A) 01/24/2020 1454  APPEARANCEUR HAZY (A) 01/24/2020 1454   APPEARANCEUR Clear 03/19/2017 1513   LABSPEC 1.027 01/24/2020 1454   PHURINE 5.0 01/24/2020 1454   GLUCOSEU NEGATIVE 01/24/2020 1454   HGBUR NEGATIVE 01/24/2020 1454   BILIRUBINUR NEGATIVE 01/24/2020 1454   BILIRUBINUR Negative 03/19/2017 1513   KETONESUR NEGATIVE 01/24/2020 1454   PROTEINUR 100 (A) 01/24/2020 1454   NITRITE NEGATIVE 01/24/2020 1454   LEUKOCYTESUR NEGATIVE 01/24/2020 1454    Radiological Exams on Admission: CT Head Wo Contrast  Result Date: 01/24/2020 CLINICAL DATA:  Ataxia.  Hallucinations. EXAM: CT HEAD WITHOUT CONTRAST TECHNIQUE: Contiguous axial images were obtained from the base of the skull through the vertex without intravenous contrast. COMPARISON:  Mar 08, 2017. FINDINGS: Brain: No evidence of acute infarction, hemorrhage, hydrocephalus, extra-axial collection or mass lesion/mass effect. Advanced atrophy and chronic microvascular ischemic changes are noted. There are left-sided basal ganglia lacunar infarcts. Vascular: No hyperdense vessel or unexpected calcification. Skull: Normal. Negative for fracture or focal lesion. Sinuses/Orbits: There mucosal retention cysts involving the bilateral maxillary sinuses.  There is some mucosal thickening involving the bilateral ethmoid air cells. The remaining paranasal sinuses and mastoid air cells are essentially clear. Other: None. IMPRESSION: No acute intracranial abnormality. Atrophy and advanced chronic microvascular ischemic changes are noted. Electronically Signed   By: Constance Holster M.D.   On: 01/24/2020 16:58   DG Chest Port 1 View  Result Date: 01/24/2020 CLINICAL DATA:  Weakness. EXAM: PORTABLE CHEST 1 VIEW COMPARISON:  January 16, 2010 FINDINGS: Heart size is significantly enlarged. Aortic calcifications are noted. There is mild vascular congestion without overt pulmonary edema. The lungs appear to be slightly hyperexpanded. There is some blunting of the costophrenic angles bilaterally. The right paratracheal stripe appears thickened which is likely secondary to vascular structures in this region as seen on the patient's prior CT neck. IMPRESSION: Cardiomegaly with mild vascular congestion. Electronically Signed   By: Constance Holster M.D.   On: 01/24/2020 15:47    EKG: Independently reviewed.  Atrial fibrillation rate 78,, diffuse T wave flattening that is consistent with prior EKG.  QTC prolonged at 561.  Assessment/Plan Active Problems:   Chest pain   Chest pain r/o ACS with CAD hx -typical chest pain.  Resolves with nitroglycerin.  Hs troponin  149 >> 141. EKG unchanged from prior. Similar chest pain in the past, history of percutaneous coronary rotoblator intervention (pci-r) (05/03/06) and Cardiac catheterization (05/03/06).  - EDP talked to Dr. Radford Pax, will admit to [facility], and start heparin drip -Trend troponin - EKG a.m. -Obtain echocardiogram -N.p.o. midnight -As needed nitroglycerin - Resume imdur  Paroxysmal atrial fibrillation-currently in atrial fibrillation, rate controlled on metoprolol.  Per notes 2017, patient declined more aggressive anticoagulation, prefers to stay on aspirin and Plavix. -Start heparin for ACS rule out  -Resume aspirin and Plavix, -Resume metoprolol  Hypertension-stable. -Resume home metoprolol, imdur. - awaiting med rec. ? If taking norvasc, he doesn't know all the names of his medications.  Alzheimer's dementia- reported ?? visual hallucinations today.  Currently he is awake alert oriented x4.  Ambulates without assistive devices or assistance.  Still drives.  Head CT unremarkable.  Diabetes- random glucose 201.  -Hold home Metformin - SSI  DVT prophylaxis: Heparin drip.  Code Status: Full code.  Confirmed with patient at bedside, patient's son [name] is also at bedside.  Spouse Remo Lipps will be Pharmacist, hospital. Family Communication: Patient's son [name] at bedside, all questions answered. Disposition Plan: ~ 2 days Consults called: Cardiology Admission status: Observation, telemetry  Bethena Roys MD [facility]  01/24/2020, 7:53 PM

## 2020-01-24 NOTE — Consult Note (Signed)
Admit date: 01/24/2020 Referring Physician  Jenetta Downer, MD Primary Physician  Claretta Fraise, MD Primary Cardiologist  Casandra Doffing, MD Reason for Consultation  NSTEMI  HPI: Patrick Potts is a 86 y.o. male who is being seen today for the evaluation of NSTEMI at the request of Jenetta Downer, MD.  This is a 86yo male with a hx of ASCAD. S/P PCI with BMS to ostial RCA 04/2006, PCI/Rotablator DES to LAD/Diag 06/12/2006 and known occluded LCx/OM small vessel with normal LVF.  He also has a hx of PAF on chronic anticoagulation for CHADS2VASC score of 4.    The patient has Alzheimer's demential and is a poor historian.  He tells me that he has had chest pain for years and when I asked him how long he had been having his recent CP he could not tell me and continued to focus on the past.  He tells me that he takes a NTG daily to prevent him from getting CP and sounds like this is not something new.  He does not know why he came to the ER and told me it was just "to get a ride".  He says that his pain is a pressure misternal with no radiation but cannot tell me what he is doing when it comes on.  He told the hospitalist that it comes on with activity or strenuous activity.  He denies any DOE, PND, orthopnea.  He thinks the pain is similar to what he had with his stents.   Apparently the patient though he saw someone sitting on a chair who was actually not there and his son told the hospitalist that his dementia is mild.  He presented to [facility] ER for evaluation and was noted to have a hsTrop of 149>141.  EKG showed atrial fibrillation with prolonged QTc of 561, Cxray with mild vascular congestion, head CT neg.  He has not seen a Cardiologist since 2017.  He is now transferred to [facility] service and Cards has been asked to consult.  He currently says that he is fine with no CP.     PMH:   Past Medical History:  Diagnosis Date  . Alzheimer's type dementia ([place])   . ASCVD (arteriosclerotic  cardiovascular disease)    multivessel, s/p PCI/BM stent implant, ostial RCA, 04/30/06---s/p PCI/Rotablator-assisted DE stent implant LAD diagonal 06/12/06----known occlusion LCx OM small vessel--Intact overall LVEF 55-60% on cath 05/03/06  . Back pain    remote history  . Chronic atrial fibrillation (H[facility])    CHADS2VASC score 4 - refused anticoagulation in the past  . DM type 2 (diabetes mellitus, type 2) ([place])   . Essential hypertension, benign 04/28/2014  . HLD (hyperlipidemia)   . Myalgia    from statins  . Myocardial infarction [facility])      PSH:   Past Surgical History:  Procedure Laterality Date  . CARDIAC CATHETERIZATION  05/03/06  . PERCUTANEOUS CORONARY ROTOBLATOR INTERVENTION (PCI-R)  05/03/06   multivessel, s/p PCI/BM stent implant, ostial RCA, 04/30/06---s/p PCI/Rotablator-assisted DE stent implant LAD diagonal 06/12/06----known occlusion LCx OM small vessel--Intact overall LVEF 55-60% on cath 05/03/06    Allergies:  Patient has no known allergies. Prior to Admit Meds:   Medications Prior to Admission  Medication Sig Dispense Refill Last Dose  . amLODipine (NORVASC) 10 MG tablet TAKE 1 TABLET BY MOUTH DAILY. 90 tablet 1 01/24/2020 at Unknown time  . aspirin 81 MG tablet Take 81 mg by mouth daily.   01/24/2020 at Unknown time  .  atorvastatin (LIPITOR) 10 MG tablet Take 1 tablet (10 mg total) by mouth daily. For cholesterol 90 tablet 1 01/24/2020 at Unknown time  . blood glucose meter kit and supplies KIT Dispense based on patient and insurance preference. Use up to four times daily as directed. (FOR ICD-9 250.00, 250.01). 1 each 0 01/24/2020 at Unknown time  . clopidogrel (PLAVIX) 75 MG tablet TAKE 1 TABLET BY MOUTH DAILY. GENERIC EQUIVALENT FOR FOR PLAVIX. 30 tablet 2 01/24/2020 at Unknown time  . metoprolol tartrate (LOPRESSOR) 50 MG tablet Take 1 tablet (50 mg total) by mouth 2 (two) times daily. 180 tablet 1 01/24/2020 at Unknown time  . Continuous Blood Gluc Receiver (FREESTYLE LIBRE 14 DAY  READER) DEVI 1 each by Does not apply route every 14 (fourteen) days. Dx E11.69 2 each 2   . Continuous Blood Gluc Sensor (FREESTYLE LIBRE 14 DAY SENSOR) MISC USE AS DIRECTED; Dx E11.69 2 each 2   . isosorbide mononitrate (IMDUR) 30 MG 24 hr tablet Take 1 tablet (30 mg total) by mouth daily. 30 tablet 6   . metFORMIN (GLUCOPHAGE-XR) 750 MG 24 hr tablet Take 1 tablet (750 mg total) by mouth 2 (two) times daily. Generic equivalent for Glucophage XR 90 tablet 1   . nitroGLYCERIN (NITROSTAT) 0.4 MG SL tablet ONE TAB UNDER TONGUE EVERY 5 MINS--DONT EXCEED 3 TABLETS WITHIN 15 MINUTES-CALL 911 50 tablet 5    Fam HX:    Family History  Problem Relation Age of Onset  . Arrhythmia Mother   . Breast cancer Mother   . Healthy Father    Social HX:    Social History   Socioeconomic History  . Marital status: Married    Spouse name: Not on file  . Number of children: Not on file  . Years of education: Not on file  . Highest education level: Not on file  Occupational History  . Occupation: Retired Psychologist, sport and exercise  Tobacco Use  . Smoking status: Never Smoker  . Smokeless tobacco: Never Used  Substance and Sexual Activity  . Alcohol use: No  . Drug use: No  . Sexual activity: Not on file  Other Topics Concern  . Not on file  Social History Narrative  . Not on file   Social Determinants of Health   Financial Resource Strain:   . Difficulty of Paying Living Expenses:   Food Insecurity:   . Worried About Charity fundraiser in the Last Year:   . Arboriculturist in the Last Year:   Transportation Needs:   . Film/video editor (Medical):   Marland Kitchen Lack of Transportation (Non-Medical):   Physical Activity:   . Days of Exercise per Week:   . Minutes of Exercise per Session:   Stress:   . Feeling of Stress :   Social Connections:   . Frequency of Communication with Friends and Family:   . Frequency of Social Gatherings with Friends and Family:   . Attends Religious Services:   . Active Member of  Clubs or Organizations:   . Attends Archivist Meetings:   Marland Kitchen Marital Status:   Intimate Partner Violence:   . Fear of Current or Ex-Partner:   . Emotionally Abused:   Marland Kitchen Physically Abused:   . Sexually Abused:      ROS:  All  ROS were addressed and are negative except what is stated in the HPI  Physical Exam: Blood pressure 127/87, pulse 83, temperature (!) 97.4 F (36.3 C), temperature source Oral,  resp. rate 18, height '5\' 9"'$  (1.753 m), weight 71.4 kg, SpO2 99 %.    General: Well developed, well nourished, in no acute distress Head: Eyes PERRLA, No xanthomas.   Normal cephalic and atramatic  Lungs:   Clear bilaterally to auscultation and percussion. Heart:   Irregularly irregular S1 S2 Pulses are 2+ & equal.            No carotid bruit. No JVD.  No abdominal bruits. No femoral bruits. Abdomen: Bowel sounds are positive, abdomen soft and non-tender without masses or                  Hernia's noted. Msk:  Back normal, normal gait. Normal strength and tone for age. Extremities:   No clubbing, cyanosis or edema.  DP +1 Neuro: Alert and oriented X 3. Psych:  Good affect, responds appropriately    Labs:   Lab Results  Component Value Date   WBC 8.3 01/24/2020   HGB 13.2 01/24/2020   HCT 41.0 01/24/2020   MCV 97.9 01/24/2020   PLT 149 (L) 01/24/2020    Recent Labs  Lab 01/24/20 1535  NA 141  K 4.1  CL 107  CO2 21*  BUN 39*  CREATININE 1.24  CALCIUM 9.8  PROT 7.2  BILITOT 2.7*  ALKPHOS 72  ALT 32  AST 42*  GLUCOSE 201*   No results found for: PTT Lab Results  Component Value Date   INR 1.01 03/08/2017   INR 1.17 01/16/2010   Lab Results  Component Value Date   CKTOTAL 69 01/17/2010   CKMB 1.9 01/17/2010   TROPONINI 0.04        NO INDICATION OF MYOCARDIAL INJURY. 01/17/2010     Lab Results  Component Value Date   CHOL 216 (H) 06/23/2019   CHOL 146 03/19/2017   CHOL 165 07/17/2016   Lab Results  Component Value Date   HDL 54 06/23/2019     HDL 50 03/19/2017   HDL 63 07/17/2016   Lab Results  Component Value Date   LDLCALC 132 (H) 06/23/2019   LDLCALC 77 03/19/2017   LDLCALC 87 07/17/2016   Lab Results  Component Value Date   TRIG 152 (H) 06/23/2019   TRIG 94 03/19/2017   TRIG 75 07/17/2016   Lab Results  Component Value Date   CHOLHDL 4.0 06/23/2019   CHOLHDL 2.9 03/19/2017   CHOLHDL 2.6 07/17/2016   No results found for: LDLDIRECT    Radiology:  CT Head Wo Contrast  Result Date: 01/24/2020 CLINICAL DATA:  Ataxia.  Hallucinations. EXAM: CT HEAD WITHOUT CONTRAST TECHNIQUE: Contiguous axial images were obtained from the base of the skull through the vertex without intravenous contrast. COMPARISON:  Mar 08, 2017. FINDINGS: Brain: No evidence of acute infarction, hemorrhage, hydrocephalus, extra-axial collection or mass lesion/mass effect. Advanced atrophy and chronic microvascular ischemic changes are noted. There are left-sided basal ganglia lacunar infarcts. Vascular: No hyperdense vessel or unexpected calcification. Skull: Normal. Negative for fracture or focal lesion. Sinuses/Orbits: There mucosal retention cysts involving the bilateral maxillary sinuses. There is some mucosal thickening involving the bilateral ethmoid air cells. The remaining paranasal sinuses and mastoid air cells are essentially clear. Other: None. IMPRESSION: No acute intracranial abnormality. Atrophy and advanced chronic microvascular ischemic changes are noted. Electronically Signed   By: Constance Holster M.D.   On: 01/24/2020 16:58   DG Chest Port 1 View  Result Date: 01/24/2020 CLINICAL DATA:  Weakness. EXAM: PORTABLE CHEST 1 VIEW COMPARISON:  January 16, 2010 FINDINGS: Heart size is significantly enlarged. Aortic calcifications are noted. There is mild vascular congestion without overt pulmonary edema. The lungs appear to be slightly hyperexpanded. There is some blunting of the costophrenic angles bilaterally. The right paratracheal stripe  appears thickened which is likely secondary to vascular structures in this region as seen on the patient's prior CT neck. IMPRESSION: Cardiomegaly with mild vascular congestion. Electronically Signed   By: Constance Holster M.D.   On: 01/24/2020 15:47     Telemetry    Atrial fibrillation - Personally Reviewed  ECG    Atrial fibrillation with RBBB, low voltage and anterior infarct - Personally Reviewed   ASSESSMENT/PLAN:    1.  Chest pain -the patient has Alzheimner's dementia and accurate hx is difficult to obtain -he tells me he has been having CP for years and takes an NTG daily to make sure he does not get CP - cannot discern whether this is his Imdur he is talking about or SL NTG -doesn't know why he went to ER today and thought it was "to take a ride" -apparently was seeing someone in a chair that was not there -hsTrop in ER was elevated with flat trend at 149>141 -denies CP at present -continue IV heparin gtt -cycle trop -continue statin, ASA, Plavix, BB, Imdur -check 2D echo in am to assess LVF -further workup pending results of echo and labs and will need to factor in degree of dementia present.  Likely medical management best option given dementia and advanced age.   2.  ASCAD -S/P PCI with BMS to ostial RCA 04/2006, PCI/Rotablator DES to LAD/Diag 06/12/2006 and known occluded LCx/OM small vessel with normal LVF. -continue ASA, plavix, BB, long acting nitrate, statin  3.  HLD -LDL goal < 70 -continue Atorvastatin '10mg'$  daily -check FLP in am  4.  HTN -BP controlled -continue Lopressor '50mg'$  BID and amlodipine '10mg'$  daily  5.  DM2 -followed by PCP -continue metformin -per TRH  6.  Alzheimer's dementia -seeing person in a chair today that was not there -workup per TRH  7.  Chronic atrial fibrillation -CHADS2VASC score 4 but currently not on anticoagulation - patient has declined in the past -continue metoprolol '50mg'$  BID for rate control  Fransico Him, MD    01/24/2020  11:04 PM

## 2020-01-25 ENCOUNTER — Observation Stay (HOSPITAL_BASED_OUTPATIENT_CLINIC_OR_DEPARTMENT_OTHER): Payer: Medicare Other

## 2020-01-25 ENCOUNTER — Encounter (HOSPITAL_COMMUNITY): Payer: Self-pay | Admitting: Internal Medicine

## 2020-01-25 ENCOUNTER — Observation Stay (HOSPITAL_COMMUNITY): Payer: Medicare Other

## 2020-01-25 DIAGNOSIS — I480 Paroxysmal atrial fibrillation: Secondary | ICD-10-CM

## 2020-01-25 DIAGNOSIS — R0789 Other chest pain: Secondary | ICD-10-CM

## 2020-01-25 DIAGNOSIS — I25119 Atherosclerotic heart disease of native coronary artery with unspecified angina pectoris: Secondary | ICD-10-CM | POA: Diagnosis not present

## 2020-01-25 DIAGNOSIS — G301 Alzheimer's disease with late onset: Secondary | ICD-10-CM | POA: Diagnosis not present

## 2020-01-25 DIAGNOSIS — E1165 Type 2 diabetes mellitus with hyperglycemia: Secondary | ICD-10-CM | POA: Diagnosis not present

## 2020-01-25 DIAGNOSIS — R079 Chest pain, unspecified: Secondary | ICD-10-CM | POA: Diagnosis not present

## 2020-01-25 DIAGNOSIS — I2510 Atherosclerotic heart disease of native coronary artery without angina pectoris: Secondary | ICD-10-CM

## 2020-01-25 LAB — ECHOCARDIOGRAM COMPLETE
Height: 69 in
Weight: 2520 oz

## 2020-01-25 LAB — SARS CORONAVIRUS 2 (TAT 6-24 HRS): SARS Coronavirus 2: NEGATIVE

## 2020-01-25 LAB — TROPONIN I (HIGH SENSITIVITY)
Troponin I (High Sensitivity): 162 ng/L (ref <18)
Troponin I (High Sensitivity): 179 ng/L (ref <18)
Troponin I (High Sensitivity): 187 ng/L (ref <18)

## 2020-01-25 LAB — GLUCOSE, CAPILLARY: Glucose-Capillary: 170 mg/dL — ABNORMAL HIGH (ref 70–99)

## 2020-01-25 LAB — HEPARIN LEVEL (UNFRACTIONATED): Heparin Unfractionated: 0.27 IU/mL — ABNORMAL LOW (ref 0.30–0.70)

## 2020-01-25 MED ORDER — PERFLUTREN LIPID MICROSPHERE
1.0000 mL | INTRAVENOUS | Status: AC | PRN
  Administered 2020-01-25: 2 mL via INTRAVENOUS
  Filled 2020-01-25: qty 10

## 2020-01-25 MED ORDER — HYDROXYZINE HCL 25 MG PO TABS
25.0000 mg | ORAL_TABLET | Freq: Once | ORAL | Status: AC
  Administered 2020-01-25: 25 mg via ORAL
  Filled 2020-01-25: qty 1

## 2020-01-25 MED ORDER — LORAZEPAM 2 MG/ML IJ SOLN
0.5000 mg | Freq: Once | INTRAMUSCULAR | Status: AC
  Administered 2020-01-26: 0.5 mg via INTRAVENOUS
  Filled 2020-01-25: qty 1

## 2020-01-25 MED ORDER — MAGNESIUM SULFATE 2 GM/50ML IV SOLN
2.0000 g | Freq: Once | INTRAVENOUS | Status: AC
  Administered 2020-01-25: 2 g via INTRAVENOUS
  Filled 2020-01-25: qty 50

## 2020-01-25 MED ORDER — INSULIN ASPART 100 UNIT/ML ~~LOC~~ SOLN
0.0000 [IU] | Freq: Three times a day (TID) | SUBCUTANEOUS | Status: DC
  Administered 2020-01-29: 1 [IU] via SUBCUTANEOUS

## 2020-01-25 NOTE — Progress Notes (Signed)
  Echocardiogram 2D Echocardiogram has been performed.  Patrick Potts F 01/25/2020, 6:15 PM

## 2020-01-25 NOTE — Care Management Obs Status (Signed)
MEDICARE OBSERVATION STATUS NOTIFICATION   Patient Details  Name: DAANISH COPES MRN: [id number] Date of Birth: 02/22/1925   Medicare Observation Status Notification Given:  Yes    Deveron Furlong, RN 01/25/2020, 4:37 PM

## 2020-01-25 NOTE — Progress Notes (Signed)
Progress Note  Patient Name: Patrick Potts Date of Encounter: 01/25/2020  Primary Cardiologist: No primary care provider on file.   Subjective   Patient was seen overnight by Dr. Mayford Knife.  At that time he was experiencing some disorientation and was unable to give a clear history.  Patient is entirely lucid this morning and his son Loraine Leriche is in the room with him.  He tells me he is chest pain-free.  Loraine Leriche tells me that they brought their father into the hospital due to hallucinations that have been occurring.  He was seeing people who were not in the room and was confused.  On presentation it was incidentally noted that he had been taking more nitroglycerin lately.  He tells me that he no longer climbs the steep hill to his mailbox, and will send his wife for will take his car.  He does not endorse chest pain but just tells me he does not do this anymore.  He is taking nitroglycerin 1 tab a few times a week.  2 days ago he had to take 2 nitroglycerin for chest pain.  Currently chest pain-free, and not inclined to press forward with any additional testing or interventions since he says he feels well.  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  Inpatient Medications    Scheduled Meds: . aspirin EC  81 mg Oral Daily  . atorvastatin  10 mg Oral q1800  . clopidogrel  75 mg Oral Daily  . isosorbide mononitrate  30 mg Oral Daily  . metoprolol tartrate  50 mg Oral BID   Continuous Infusions: . heparin 850 Units/hr (01/25/20 0509)   PRN Meds: acetaminophen, nitroGLYCERIN, ondansetron (ZOFRAN) IV   Vital Signs    Vitals:   01/24/20 2214 01/24/20 2341 01/25/20 0401 01/25/20 0823  BP: 127/87  116/86 128/72  Pulse: 83 (!) 107 69 75  Resp: 18  (!) 21   Temp: (!) 97.4 F (36.3 C)  97.9 F (36.6 C) 97.9 F (36.6 C)  TempSrc: Oral  Oral Oral  SpO2: 99%  99% 100%  Weight: 71.4 kg     Height: 5\' 9"  (1.753 m)       Intake/Output Summary (Last  24 hours) at 01/25/2020 0827 Last data filed at 01/25/2020 0300 Gross per 24 hour  Intake 58.79 ml  Output --  Net 58.79 ml   Last 3 Weights 01/24/2020 01/24/2020 06/23/2019  Weight (lbs) 157 lb 8 oz 154 lb 5.2 oz 167 lb  Weight (kg) 71.442 kg 70 kg 75.751 kg      Telemetry    afib - Personally Reviewed  ECG    Afib, RBBB, anterior infarct, artifact- Personally Reviewed  Physical Exam   GEN: No acute distress.   Neck: No JVD Cardiac: iRRR, no murmurs, rubs, or gallops.  Respiratory: Clear to auscultation bilaterally. GI: Soft, nontender, non-distended  MS: No edema; No deformity. Neuro:  Nonfocal  Psych: Normal affect   Labs    High Sensitivity Troponin:   Recent Labs  Lab 01/24/20 1535 01/24/20 1730  TROPONINIHS 149* 141*      Chemistry Recent Labs  Lab 01/24/20 1535  NA 141  K 4.1  CL 107  CO2 21*  GLUCOSE 201*  BUN 39*  CREATININE 1.24  CALCIUM 9.8  PROT 7.2  ALBUMIN 4.3  AST 42*  ALT 32  ALKPHOS 72  BILITOT 2.7*  GFRNONAA 50*  GFRAA 58*  ANIONGAP 13     Hematology Recent Labs  Lab 01/24/20 1535  WBC 8.3  RBC 4.19*  HGB 13.2  HCT 41.0  MCV 97.9  MCH 31.5  MCHC 32.2  RDW 14.1  PLT 149*    BNPNo results for input(s): BNP, PROBNP in the last 168 hours.   DDimer No results for input(s): DDIMER in the last 168 hours.   Radiology    CT Head Wo Contrast  Result Date: 01/24/2020 CLINICAL DATA:  Ataxia.  Hallucinations. EXAM: CT HEAD WITHOUT CONTRAST TECHNIQUE: Contiguous axial images were obtained from the base of the skull through the vertex without intravenous contrast. COMPARISON:  Mar 08, 2017. FINDINGS: Brain: No evidence of acute infarction, hemorrhage, hydrocephalus, extra-axial collection or mass lesion/mass effect. Advanced atrophy and chronic microvascular ischemic changes are noted. There are left-sided basal ganglia lacunar infarcts. Vascular: No hyperdense vessel or unexpected calcification. Skull: Normal. Negative for fracture  or focal lesion. Sinuses/Orbits: There mucosal retention cysts involving the bilateral maxillary sinuses. There is some mucosal thickening involving the bilateral ethmoid air cells. The remaining paranasal sinuses and mastoid air cells are essentially clear. Other: None. IMPRESSION: No acute intracranial abnormality. Atrophy and advanced chronic microvascular ischemic changes are noted. Electronically Signed   By: Constance Holster M.D.   On: 01/24/2020 16:58   DG Chest Port 1 View  Result Date: 01/24/2020 CLINICAL DATA:  Weakness. EXAM: PORTABLE CHEST 1 VIEW COMPARISON:  January 16, 2010 FINDINGS: Heart size is significantly enlarged. Aortic calcifications are noted. There is mild vascular congestion without overt pulmonary edema. The lungs appear to be slightly hyperexpanded. There is some blunting of the costophrenic angles bilaterally. The right paratracheal stripe appears thickened which is likely secondary to vascular structures in this region as seen on the patient's prior CT neck. IMPRESSION: Cardiomegaly with mild vascular congestion. Electronically Signed   By: Constance Holster M.D.   On: 01/24/2020 15:47    Cardiac Studies   Echo pending  Patient Profile     86 y.o. male with a hx of ASCAD. S/P PCI with BMS to ostial RCA 04/2006, PCI/Rotablator DES to LAD/Diag 06/12/2006 and known occluded LCx/OM small vessel with normal LVF.  He also has a hx of PAF on chronic anticoagulation for CHADS2VASC score of 4. Alzheimer's dementia now with hallucinations at night suggestive of sundowning.   Assessment & Plan    1.  Chest pain -echo pending for wall motion abnormalities and EF, no prior echo available -   Pending results of echo, will discuss options for coronary angiography versus medical management.  Patient is more inclined to pursue medical management since he does not feel his chest pain is a significant issue.  We will take this into consideration, further decisions after echo.  2.   ASCAD -S/P PCI with BMS to ostial RCA 04/2006, PCI/Rotablator DES to LAD/Diag 06/12/2006 and known occluded LCx/OM small vessel with normal LVF. -continue ASA, plavix, BB, long acting nitrate, statin -consider increasing Imdur for chest pain  3.  HLD -LDL goal < 70 -continue Atorvastatin 10mg  daily -check FLP   4.  HTN -BP controlled -continue Lopressor 50mg  BID and amlodipine 10mg  daily  5.  DM2 -followed by PCP -continue metformin -per TRH  6.  Alzheimer's dementia -Lucid and thinking quite clearly today.  Probable element of sundowning.  7.  Chronic atrial fibrillation -CHADS2VASC score 4 but currently not on anticoagulation - patient has declined in the past -continue metoprolol 50mg  BID for rate control  For questions or updates, please contact [facility] Please consult [email or web address] for contact info under        Signed, Parke Poisson, MD  01/25/2020, 8:27 AM
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
## 2020-01-25 NOTE — Progress Notes (Signed)
CRITICAL VALUE ALERT  Critical Value:  Troponin 162  Date & Time Notied:  01/25/20 1015hrs  Provider Notified: Ronny Flurry PA  Orders Received/Actions taken: no new orders at this time.
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
## 2020-01-25 NOTE — Progress Notes (Addendum)
[place] for heparin gtt  Indication: chest pain/ACS  No Known Allergies  Patient Measurements: Height: 5' 9" (175.3 cm) Weight: 157 lb 8 oz (71.4 kg) IBW/kg (Calculated) : 70.7 Heparin Dosing Weight: HEPARIN DW (KG): 71.4   Vital Signs: Temp: 98.2 F (36.8 C) (03/21 1255) Temp Source: Oral (03/21 1255) BP: 105/80 (03/21 1255) Pulse Rate: 66 (03/21 1255)  Labs: Recent Labs    01/24/20 1535 01/25/20 1204  HGB 13.2  --   HCT 41.0  --   PLT 149*  --   HEPARINUNFRC  --  0.27*  CREATININE 1.24  --     Estimated Creatinine Clearance: 38 mL/min (by C-G formula based on SCr of 1.24 mg/dL).   Medical History: Past Medical History:  Diagnosis Date  . Alzheimer's type dementia ([place])   . ASCVD (arteriosclerotic cardiovascular disease)    multivessel, s/p PCI/BM stent implant, ostial RCA, 04/30/06---s/p PCI/Rotablator-assisted DE stent implant LAD diagonal 06/12/06----known occlusion LCx OM small vessel--Intact overall LVEF 55-60% on cath 05/03/06  . Back pain    remote history  . Chronic atrial fibrillation (HCC)    CHADS2VASC score 4 - refused anticoagulation in the past  . DM type 2 (diabetes mellitus, type 2) ([place])   . Essential hypertension, benign 04/28/2014  . HLD (hyperlipidemia)   . Myalgia    from statins  . Myocardial infarction [facility])     Medications:  Medications Prior to Admission  Medication Sig Dispense Refill Last Dose  . amLODipine (NORVASC) 10 MG tablet TAKE 1 TABLET BY MOUTH DAILY. (Patient taking differently: Take 10 mg by mouth daily. ) 90 tablet 1 01/24/2020 at Unknown time  . aspirin 81 MG tablet Take 81 mg by mouth daily.   01/24/2020 at Unknown time  . blood glucose meter kit and supplies KIT Dispense based on patient and insurance preference. Use up to four times daily as directed. (FOR ICD-9 250.00, 250.01). 1 each 0 01/24/2020 at Unknown time  . clopidogrel (PLAVIX) 75 MG tablet TAKE 1 TABLET BY MOUTH DAILY.  GENERIC EQUIVALENT FOR FOR PLAVIX. (Patient taking differently: Take 75 mg by mouth daily. ) 30 tablet 2 01/24/2020 at 0900  . isosorbide mononitrate (IMDUR) 30 MG 24 hr tablet Take 1 tablet (30 mg total) by mouth daily. 30 tablet 6 01/24/2020 at Unknown time  . metFORMIN (GLUCOPHAGE-XR) 750 MG 24 hr tablet Take 1 tablet (750 mg total) by mouth 2 (two) times daily. Generic equivalent for Glucophage XR 90 tablet 1 01/24/2020 at Unknown time  . metoprolol tartrate (LOPRESSOR) 50 MG tablet Take 1 tablet (50 mg total) by mouth 2 (two) times daily. 180 tablet 1 01/24/2020 at 0900  . nitroGLYCERIN (NITROSTAT) 0.4 MG SL tablet ONE TAB UNDER TONGUE EVERY 5 MINS--DONT EXCEED 3 TABLETS WITHIN 15 MINUTES-CALL 911 (Patient taking differently: Place 0.4 mg under the tongue every 5 (five) minutes as needed for chest pain. ) 50 tablet 5 01/23/2020  . atorvastatin (LIPITOR) 10 MG tablet Take 1 tablet (10 mg total) by mouth daily. For cholesterol (Patient not taking: Reported on 01/25/2020) 90 tablet 1 Not Taking at Unknown time  . Continuous Blood Gluc Receiver (FREESTYLE LIBRE 14 DAY READER) DEVI 1 each by Does not apply route every 14 (fourteen) days. Dx E11.69 2 each 2   . Continuous Blood Gluc Sensor (FREESTYLE LIBRE 14 DAY SENSOR) MISC USE AS DIRECTED; Dx E11.69 2 each 2    Scheduled:  . aspirin EC  81 mg Oral Daily  .  atorvastatin  10 mg Oral q1800  . clopidogrel  75 mg Oral Daily  . isosorbide mononitrate  30 mg Oral Daily  . metoprolol tartrate  50 mg Oral BID   Infusions:  . heparin 850 Units/hr (01/25/20 1316)  . magnesium sulfate bolus IVPB 2 g (01/25/20 1433)   PRN: acetaminophen, nitroGLYCERIN, ondansetron (ZOFRAN) IV Anti-infectives (From admission, onward)   None      Assessment: Yogesh L Vitullo a 86 y.o. male on hep gtt for ACS - no AC PTA.   IV line was lost overnight - heparin level was retimed to start after restart. Initial level came back slightly subtherapeutic at 0.27, on 850 units/hr.  Hgb 13.2, plt 149. Trop increased slightly to 179.   Goal of Therapy:  Heparin level 0.3-0.7 units/ml Monitor platelets by anticoagulation protocol: Yes   Plan:  Will increase heparin infusion at 1000 units/hr to get in goal range Monitor heparin level daily, CBC, and for s/sx of bleeding    , PharmD, BCCCP Clinical Pharmacist  Phone: [phone]  Please check AMION for all [facility] Pharmacy phone numbers After 10:00 PM, call Main Pharmacy [phone] 01/25/2020,2:36 PM   

## 2020-01-26 DIAGNOSIS — I5021 Acute systolic (congestive) heart failure: Secondary | ICD-10-CM

## 2020-01-26 DIAGNOSIS — I350 Nonrheumatic aortic (valve) stenosis: Secondary | ICD-10-CM | POA: Diagnosis present

## 2020-01-26 DIAGNOSIS — Z7189 Other specified counseling: Secondary | ICD-10-CM | POA: Diagnosis not present

## 2020-01-26 DIAGNOSIS — Z515 Encounter for palliative care: Secondary | ICD-10-CM | POA: Diagnosis not present

## 2020-01-26 DIAGNOSIS — N179 Acute kidney failure, unspecified: Secondary | ICD-10-CM | POA: Diagnosis not present

## 2020-01-26 DIAGNOSIS — I429 Cardiomyopathy, unspecified: Secondary | ICD-10-CM | POA: Diagnosis present

## 2020-01-26 DIAGNOSIS — R17 Unspecified jaundice: Secondary | ICD-10-CM | POA: Diagnosis present

## 2020-01-26 DIAGNOSIS — F0281 Dementia in other diseases classified elsewhere with behavioral disturbance: Secondary | ICD-10-CM | POA: Diagnosis present

## 2020-01-26 DIAGNOSIS — G301 Alzheimer's disease with late onset: Secondary | ICD-10-CM | POA: Diagnosis not present

## 2020-01-26 DIAGNOSIS — I130 Hypertensive heart and chronic kidney disease with heart failure and stage 1 through stage 4 chronic kidney disease, or unspecified chronic kidney disease: Secondary | ICD-10-CM | POA: Diagnosis present

## 2020-01-26 DIAGNOSIS — F05 Delirium due to known physiological condition: Secondary | ICD-10-CM | POA: Diagnosis not present

## 2020-01-26 DIAGNOSIS — I208 Other forms of angina pectoris: Secondary | ICD-10-CM | POA: Diagnosis present

## 2020-01-26 DIAGNOSIS — E785 Hyperlipidemia, unspecified: Secondary | ICD-10-CM | POA: Diagnosis present

## 2020-01-26 DIAGNOSIS — N181 Chronic kidney disease, stage 1: Secondary | ICD-10-CM | POA: Diagnosis present

## 2020-01-26 DIAGNOSIS — D696 Thrombocytopenia, unspecified: Secondary | ICD-10-CM

## 2020-01-26 DIAGNOSIS — Z66 Do not resuscitate: Secondary | ICD-10-CM | POA: Diagnosis present

## 2020-01-26 DIAGNOSIS — I5022 Chronic systolic (congestive) heart failure: Secondary | ICD-10-CM | POA: Diagnosis present

## 2020-01-26 DIAGNOSIS — I214 Non-ST elevation (NSTEMI) myocardial infarction: Secondary | ICD-10-CM | POA: Diagnosis present

## 2020-01-26 DIAGNOSIS — I482 Chronic atrial fibrillation, unspecified: Secondary | ICD-10-CM | POA: Diagnosis present

## 2020-01-26 DIAGNOSIS — Z20822 Contact with and (suspected) exposure to covid-19: Secondary | ICD-10-CM | POA: Diagnosis present

## 2020-01-26 DIAGNOSIS — R443 Hallucinations, unspecified: Secondary | ICD-10-CM | POA: Diagnosis present

## 2020-01-26 DIAGNOSIS — R441 Visual hallucinations: Secondary | ICD-10-CM | POA: Diagnosis present

## 2020-01-26 DIAGNOSIS — I25119 Atherosclerotic heart disease of native coronary artery with unspecified angina pectoris: Secondary | ICD-10-CM | POA: Diagnosis not present

## 2020-01-26 DIAGNOSIS — I480 Paroxysmal atrial fibrillation: Secondary | ICD-10-CM | POA: Diagnosis present

## 2020-01-26 DIAGNOSIS — E1122 Type 2 diabetes mellitus with diabetic chronic kidney disease: Secondary | ICD-10-CM | POA: Diagnosis present

## 2020-01-26 DIAGNOSIS — G309 Alzheimer's disease, unspecified: Secondary | ICD-10-CM | POA: Diagnosis present

## 2020-01-26 DIAGNOSIS — R079 Chest pain, unspecified: Secondary | ICD-10-CM | POA: Diagnosis not present

## 2020-01-26 DIAGNOSIS — I2510 Atherosclerotic heart disease of native coronary artery without angina pectoris: Secondary | ICD-10-CM | POA: Diagnosis present

## 2020-01-26 DIAGNOSIS — F419 Anxiety disorder, unspecified: Secondary | ICD-10-CM | POA: Diagnosis present

## 2020-01-26 LAB — LIPID PANEL
Cholesterol: 157 mg/dL (ref 0–200)
HDL: 48 mg/dL (ref >40)
LDL Cholesterol: 102 mg/dL — ABNORMAL HIGH (ref 0–99)
Total CHOL/HDL Ratio: 3.3 RATIO
Triglycerides: 37 mg/dL (ref <150)
VLDL: 7 mg/dL (ref 0–40)

## 2020-01-26 LAB — BASIC METABOLIC PANEL
Anion gap: 11 (ref 5–15)
BUN: 36 mg/dL — ABNORMAL HIGH (ref 8–23)
CO2: 23 mmol/L (ref 22–32)
Calcium: 9.3 mg/dL (ref 8.9–10.3)
Chloride: 107 mmol/L (ref 98–111)
Creatinine, Ser: 1.34 mg/dL — ABNORMAL HIGH (ref 0.61–1.24)
GFR calc Af Amer: 53 mL/min — ABNORMAL LOW (ref >60)
GFR calc non Af Amer: 46 mL/min — ABNORMAL LOW (ref >60)
Glucose, Bld: 150 mg/dL — ABNORMAL HIGH (ref 70–99)
Potassium: 3.8 mmol/L (ref 3.5–5.1)
Sodium: 141 mmol/L (ref 135–145)

## 2020-01-26 LAB — CBC
HCT: 35.9 % — ABNORMAL LOW (ref 39.0–52.0)
Hemoglobin: 11.4 g/dL — ABNORMAL LOW (ref 13.0–17.0)
MCH: 31.7 pg (ref 26.0–34.0)
MCHC: 31.8 g/dL (ref 30.0–36.0)
MCV: 99.7 fL (ref 80.0–100.0)
Platelets: 132 10*3/uL — ABNORMAL LOW (ref 150–400)
RBC: 3.6 MIL/uL — ABNORMAL LOW (ref 4.22–5.81)
RDW: 14.1 % (ref 11.5–15.5)
WBC: 5.5 10*3/uL (ref 4.0–10.5)
nRBC: 0 % (ref 0.0–0.2)

## 2020-01-26 LAB — HEPARIN LEVEL (UNFRACTIONATED): Heparin Unfractionated: 0.35 IU/mL (ref 0.30–0.70)

## 2020-01-26 LAB — GLUCOSE, CAPILLARY
Glucose-Capillary: 137 mg/dL — ABNORMAL HIGH (ref 70–99)
Glucose-Capillary: 97 mg/dL (ref 70–99)
Glucose-Capillary: 103 mg/dL — ABNORMAL HIGH (ref 70–99)
Glucose-Capillary: 124 mg/dL — ABNORMAL HIGH (ref 70–99)

## 2020-01-26 LAB — HEMOGLOBIN A1C
Hgb A1c MFr Bld: 7.1 % — ABNORMAL HIGH (ref 4.8–5.6)
Mean Plasma Glucose: 157.07 mg/dL

## 2020-01-26 MED ORDER — METOPROLOL SUCCINATE ER 100 MG PO TB24
100.0000 mg | ORAL_TABLET | Freq: Every day | ORAL | Status: DC
  Administered 2020-01-27 – 2020-01-29 (×3): 100 mg via ORAL
  Filled 2020-01-26 (×3): qty 1

## 2020-01-26 MED ORDER — ATORVASTATIN CALCIUM 40 MG PO TABS
40.0000 mg | ORAL_TABLET | Freq: Every day | ORAL | Status: DC
  Administered 2020-01-26: 40 mg via ORAL
  Filled 2020-01-26: qty 1
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
## 2020-01-26 NOTE — Progress Notes (Signed)
Progress Note  Patient Name: Patrick Potts Date of Encounter: 01/26/2020  Primary Cardiologist: No primary care provider on file.   Subjective   Patient had behavioral disturbance yesterday and seems more disoriented today. Sons Eddie and [name] are with him today, I met Mark yesterday. Patient is resting, I discussed results with family.   Inpatient Medications    Scheduled Meds: . aspirin EC  81 mg Oral Daily  . atorvastatin  10 mg Oral q1800  . clopidogrel  75 mg Oral Daily  . insulin aspart  0-6 Units Subcutaneous TID WC  . isosorbide mononitrate  30 mg Oral Daily  . metoprolol tartrate  50 mg Oral BID   Continuous Infusions: . heparin 1,000 Units/hr (01/25/20 1900)   PRN Meds: acetaminophen, nitroGLYCERIN, ondansetron (ZOFRAN) IV   Vital Signs    Vitals:   01/25/20 1549 01/25/20 2001 01/25/20 2104 01/26/20 0244  BP: 114/77 109/77 114/81   Pulse: 73 82 75   Resp: 16 14    Temp: 98.1 F (36.7 C) 97.8 F (36.6 C)    TempSrc: Oral Oral    SpO2: 98% 98%    Weight:    71.5 kg  Height:        Intake/Output Summary (Last 24 hours) at 01/26/2020 0820 Last data filed at 01/25/2020 1900 Gross per 24 hour  Intake 387.37 ml  Output 200 ml  Net 187.37 ml   Last 3 Weights 01/26/2020 01/24/2020 01/24/2020  Weight (lbs) 157 lb 10.1 oz 157 lb 8 oz 154 lb 5.2 oz  Weight (kg) 71.5 kg 71.442 kg 70 kg      Telemetry    Afib, PVCs - Personally Reviewed  ECG    Afib, RBBB, anterior infarct, artifact 01/25/20- Personally Reviewed  Physical Exam   Constitutional: appears sedated, fidgeting Cardiovascular: irregular rhythm. Distant heart sounds Respiratory: clear GI : soft and nontender MSK: extremities warm, well perfused. No edema.  NEURO: grossly nonfocal exam, moves all extremities. PSYCH: not alert or oriented, appears sedate  Labs    High Sensitivity Troponin:   Recent Labs  Lab 01/24/20 1535 01/24/20 1730 01/25/20 0839 01/25/20 1204 01/25/20 1348   TROPONINIHS 149* 141* 162* 179* 187*      Chemistry Recent Labs  Lab 01/24/20 1535 01/26/20 0437  NA 141 141  K 4.1 3.8  CL 107 107  CO2 21* 23  GLUCOSE 201* 150*  BUN 39* 36*  CREATININE 1.24 1.34*  CALCIUM 9.8 9.3  PROT 7.2  --   ALBUMIN 4.3  --   AST 42*  --   ALT 32  --   ALKPHOS 72  --   BILITOT 2.7*  --   GFRNONAA 50* 46*  GFRAA 58* 53*  ANIONGAP 13 11     Hematology Recent Labs  Lab 01/24/20 1535 01/26/20 0437  WBC 8.3 5.5  RBC 4.19* 3.60*  HGB 13.2 11.4*  HCT 41.0 35.9*  MCV 97.9 99.7  MCH 31.5 31.7  MCHC 32.2 31.8  RDW 14.1 14.1  PLT 149* 132*    BNPNo results for input(s): BNP, PROBNP in the last 168 hours.   DDimer No results for input(s): DDIMER in the last 168 hours.   Radiology    CT Head Wo Contrast  Result Date: 01/24/2020 CLINICAL DATA:  Ataxia.  Hallucinations. EXAM: CT HEAD WITHOUT CONTRAST TECHNIQUE: Contiguous axial images were obtained from the base of the skull through the vertex without intravenous contrast. COMPARISON:  Mar 08, 2017. FINDINGS: Brain: No evidence  of acute infarction, hemorrhage, hydrocephalus, extra-axial collection or mass lesion/mass effect. Advanced atrophy and chronic microvascular ischemic changes are noted. There are left-sided basal ganglia lacunar infarcts. Vascular: No hyperdense vessel or unexpected calcification. Skull: Normal. Negative for fracture or focal lesion. Sinuses/Orbits: There mucosal retention cysts involving the bilateral maxillary sinuses. There is some mucosal thickening involving the bilateral ethmoid air cells. The remaining paranasal sinuses and mastoid air cells are essentially clear. Other: None. IMPRESSION: No acute intracranial abnormality. Atrophy and advanced chronic microvascular ischemic changes are noted. Electronically Signed   By: Constance Holster M.D.   On: 01/24/2020 16:58   DG Chest Port 1 View  Result Date: 01/24/2020 CLINICAL DATA:  Weakness. EXAM: PORTABLE CHEST 1 VIEW  COMPARISON:  January 16, 2010 FINDINGS: Heart size is significantly enlarged. Aortic calcifications are noted. There is mild vascular congestion without overt pulmonary edema. The lungs appear to be slightly hyperexpanded. There is some blunting of the costophrenic angles bilaterally. The right paratracheal stripe appears thickened which is likely secondary to vascular structures in this region as seen on the patient's prior CT neck. IMPRESSION: Cardiomegaly with mild vascular congestion. Electronically Signed   By: Constance Holster M.D.   On: 01/24/2020 15:47   ECHOCARDIOGRAM COMPLETE  Result Date: 01/25/2020    ECHOCARDIOGRAM REPORT   Patient Name:   Patrick Potts Date of Exam: 01/25/2020 Medical Rec #:  [id number]       Height:       69.0 in Accession #:    [id number]      Weight:       157.5 lb Date of Birth:  11/15/1924      BSA:          1.867 m Patient Age:    58 years        BP:           114/77 mmHg Patient Gender: M               HR:           69 bpm. Exam Location:  Inpatient Procedure: 2D Echo, Cardiac Doppler and Color Doppler Indications:    R07.89 Other chest pain  History:        Patient has no prior history of Echocardiogram examinations. 86                 year-old with sudden onset altered mental status.  Sonographer:    Merrie Roof RDCS Referring Phys: [place]  1. Left ventricular ejection fraction, by estimation, is 20 to 25%. The left ventricle has severely decreased function. The left ventricle demonstrates regional wall motion abnormalities (see scoring diagram/findings for description). The left ventricular internal cavity size was mildly to moderately dilated. Left ventricular diastolic parameters are indeterminate.  2. Right ventricular systolic function is moderately reduced. The right ventricular size is mildly enlarged. There is moderately elevated pulmonary artery systolic pressure. The estimated right ventricular systolic pressure is 25.8 mmHg.  3.  Left atrial size was severely dilated.  4. Right atrial size was severely dilated.  5. The mitral valve is abnormal. Moderate mitral valve regurgitation. No evidence of mitral stenosis.  6. Tricuspid valve regurgitation is moderate.  7. The aortic valve is abnormal - severely calcified with severely restricted leaflet excursion. Aortic valve regurgitation is trivial. At least moderate-severe aortic valve stenosis, low flow low gradient. Dimensionless index is 0.19. Aortic valve mean  gradient measures 8.0 mmHg in the setting of severely reduced  cardiac output.  8. The inferior vena cava is dilated in size with <50% respiratory variability, suggesting right atrial pressure of 15 mmHg. Comparison(s): No prior Echocardiogram. FINDINGS  Left Ventricle: Left ventricular ejection fraction, by estimation, is 20 to 25%. The left ventricle has severely decreased function. The left ventricle demonstrates regional wall motion abnormalities. Definity contrast agent was given IV to delineate the left ventricular endocardial borders. The left ventricular internal cavity size was mildly to moderately dilated. There is no left ventricular hypertrophy. Left ventricular diastolic parameters are indeterminate.  LV Wall Scoring: Severe global hypokinesis with akinesis of the septum. Right Ventricle: The right ventricular size is mildly enlarged. No increase in right ventricular wall thickness. Right ventricular systolic function is moderately reduced. There is moderately elevated pulmonary artery systolic pressure. The tricuspid regurgitant velocity is 2.90 m/s, and with an assumed right atrial pressure of 15 mmHg, the estimated right ventricular systolic pressure is 49.7 mmHg. Left Atrium: Left atrial size was severely dilated. Right Atrium: Right atrial size was severely dilated. Pericardium: Trivial pericardial effusion is present. The pericardial effusion is posterior to the left ventricle. Mitral Valve: The mitral valve is  abnormal. There is mild thickening of the mitral valve leaflet(s). Normal mobility of the mitral valve leaflets. Moderate mitral valve regurgitation. No evidence of mitral valve stenosis. Tricuspid Valve: The tricuspid valve is normal in structure. Tricuspid valve regurgitation is moderate . No evidence of tricuspid stenosis. Aortic Valve: The aortic valve is abnormal. Aortic valve regurgitation is trivial. Severe aortic stenosis is present. There is severe calcifcation of the aortic valve. Aortic valve mean gradient measures 8.0 mmHg. Aortic valve peak gradient measures 9.7 mmHg. Aortic valve area, by VTI measures 0.73 cm. Pulmonic Valve: The pulmonic valve was normal in structure. Pulmonic valve regurgitation is mild. No evidence of pulmonic stenosis. Aorta: The aortic root is normal in size and structure. Venous: The inferior vena cava is dilated in size with less than 50% respiratory variability, suggesting right atrial pressure of 15 mmHg. IAS/Shunts: No atrial level shunt detected by color flow Doppler. EKG: Rhythm strip during this exam demostrated atrial fibrillation.  LEFT VENTRICLE PLAX 2D LVIDd:         6.00 cm LVIDs:         5.20 cm LV PW:         1.10 cm LV IVS:        1.00 cm LVOT diam:     2.20 cm LV SV:         28 LV SV Index:   15 LVOT Area:     3.80 cm  LV Volumes (MOD) LV vol d, MOD A2C: 145.0 ml LV vol d, MOD A4C: 103.0 ml LV vol s, MOD A2C: 95.7 ml LV vol s, MOD A4C: 69.4 ml LV SV MOD A2C:     49.3 ml LV SV MOD A4C:     103.0 ml LV SV MOD BP:      38.4 ml RIGHT VENTRICLE          IVC RV Basal diam:  4.90 cm  IVC diam: 2.80 cm RV Mid diam:    3.90 cm TAPSE (M-mode): 1.0 cm LEFT ATRIUM              Index       RIGHT ATRIUM           Index LA diam:        4.20 cm  2.25 cm/m  RA Area:  25.60 cm LA Vol (A2C):   104.0 ml 55.72 ml/m RA Volume:   88.60 ml  47.47 ml/m LA Vol (A4C):   104.0 ml 55.72 ml/m LA Biplane Vol: 107.0 ml 57.33 ml/m  AORTIC VALVE AV Area (Vmax):    1.15 cm AV Area  (Vmean):   1.07 cm AV Area (VTI):     0.73 cm AV Vmax:           156.00 cm/s AV Vmean:          104.000 cm/s AV VTI:            0.379 m AV Peak Grad:      9.7 mmHg AV Mean Grad:      8.0 mmHg LVOT Vmax:         47.10 cm/s LVOT Vmean:        29.400 cm/s LVOT VTI:          0.072 m LVOT/AV VTI ratio: 0.19  AORTA Ao Root diam: 3.70 cm Ao Asc diam:  3.70 cm MR Peak grad:    84.3 mmHg   TRICUSPID VALVE MR Mean grad:    48.0 mmHg   TR Peak grad:   33.6 mmHg MR Vmax:         459.00 cm/s TR Vmax:        290.00 cm/s MR Vmean:        320.0 cm/s MR PISA:         1.57 cm    SHUNTS MR PISA Eff ROA: 11 mm      Systemic VTI:  0.07 m MR PISA Radius:  0.50 cm     Systemic Diam: 2.20 cm Cherlynn Kaiser MD Electronically signed by Cherlynn Kaiser MD Signature Date/Time: 01/25/2020/9:55:29 PM    Final     Cardiac Studies  Echo 1. Left ventricular ejection fraction, by estimation, is 20 to 25%. The  left ventricle has severely decreased function. The left ventricle  demonstrates regional wall motion abnormalities (see scoring  diagram/findings for description). The left  ventricular internal cavity size was mildly to moderately dilated. Left  ventricular diastolic parameters are indeterminate.  2. Right ventricular systolic function is moderately reduced. The right  ventricular size is mildly enlarged. There is moderately elevated  pulmonary artery systolic pressure. The estimated right ventricular  systolic pressure is 98.9 mmHg.  3. Left atrial size was severely dilated.  4. Right atrial size was severely dilated.  5. The mitral valve is abnormal. Moderate mitral valve regurgitation. No  evidence of mitral stenosis.  6. Tricuspid valve regurgitation is moderate.  7. The aortic valve is abnormal - severely calcified with severely  restricted leaflet excursion. Aortic valve regurgitation is trivial. At  least moderate-severe aortic valve stenosis, low flow low gradient.  Dimensionless index is 0.19. Aortic  valve mean  gradient measures 8.0 mmHg in the setting of severely reduced cardiac  output.  8. The inferior vena cava is dilated in size with <50% respiratory  variability, suggesting right atrial pressure of 15 mmHg.   Patient Profile     86 y.o. male with a hx of ASCAD. S/P PCI with BMS to ostial RCA 04/2006, PCI/Rotablator DES to LAD/Diag 06/12/2006 and known occluded LCx/OM small vessel with normal LVF.  He also has a hx of PAF on chronic anticoagulation for CHADS2VASC score of 4. Alzheimer's dementia now with hallucinations at night suggestive of sundowning.   Assessment & Plan   1.  Chest pain - Severely reduced EF of 20%  and probable severe AS.  - Dementia limits interventional options. - discussed with family. Patient shared with me yesterday that he is not inclined to intervene regardless of the then pending echo findings because he does not feel his heart is giving him much trouble.  - family would like to discuss, but agree that coronary angiography and TAVR workup are not in line with his goals of care, but they would like to speak to him and as a family with information about echo. Son [name] has an AVR and is a patient in our practice, he will assist with family discussions given his experience.  2.  ASCAD -S/P PCI with BMS to ostial RCA 04/2006, PCI/Rotablator DES to LAD/Diag 06/12/2006 and known occluded LCx/OM small vessel with normal LVF. -continue ASA, plavix, BB, long acting nitrate, statin - with finding of probable severe AS, would not uptitrate Imdur. Can continue current therapy at this time until further decisions are made.   3.  HLD -LDL goal < 70 -continue Atorvastatin 91m daily -check FLP   4.  HTN -BP controlled -continue Lopressor 560mBID and amlodipine 1042maily  5.  DM2 -followed by PCP -continue metformin -per TRH  6.  Alzheimer's dementia - behavior disturbance, hallucinations, and confusion.  - this may limit our ability to safely perform  procedures to investigate low EF  7.  Chronic atrial fibrillation -CHADS2VASC score 4 but currently not on anticoagulation - patient has declined in the past -continue metoprolol 63m28mD for rate control      For questions or updates, please contact [facility] consult [email or web address] for contact info under        Signed, GayaElouise Munroe  01/26/2020, 8:20 AM

## 2020-01-26 NOTE — Progress Notes (Signed)
Patient asleep most of shift. Physician aware. Holding medications until patient awake and alert and can safely take medications, physician aware. When patient woke up he was pleasant and cooperative. Son at bedside and may help with patients behavior. Sons continue to take turns staying with patient 24 hours a day to help with patient behavior and safety

## 2020-01-26 NOTE — Progress Notes (Signed)
[place] for heparin gtt  Indication: chest pain/ACS  No Known Allergies  Patient Measurements: Height: _0  (175.3 cm) Weight: 157 lb 10.1 oz (71.5 kg)(RN aware) IBW/kg (Calculated) : 70.7 Heparin Dosing Weight: HEPARIN DW (KG): 71.4   Vital Signs: Temp: 96.4 F (35.8 C) (03/22 0855) Temp Source: Axillary (03/22 0855) BP: 109/70 (03/22 0855) Pulse Rate: 65 (03/22 0855)  Labs: Recent Labs    01/24/20 1535 01/25/20 1204 01/26/20 0437  HGB 13.2  --  11.4*  HCT 41.0  --  35.9*  PLT 149*  --  132*  HEPARINUNFRC  --  0.27* 0.35  CREATININE 1.24  --  1.34*    Estimated Creatinine Clearance: 35.2 mL/min (A) (by C-G formula based on SCr of 1.34 mg/dL (H)).   Medical History: Past Medical History:  Diagnosis Date  . Alzheimer's type dementia ([place])   . ASCVD (arteriosclerotic cardiovascular disease)    multivessel, s/p PCI/BM stent implant, ostial RCA, 04/30/06---s/p PCI/Rotablator-assisted DE stent implant LAD diagonal 06/12/06----known occlusion LCx OM small vessel--Intact overall LVEF 55-60% on cath 05/03/06  . Back pain    remote history  . Chronic atrial fibrillation ([facility])    CHADS2VASC score 4 - refused anticoagulation in the past  . DM type 2 (diabetes mellitus, type 2) ([place])   . Essential hypertension, benign 04/28/2014  . HLD (hyperlipidemia)   . Myalgia    from statins  . Myocardial infarction [facility])     Medications:  Medications Prior to Admission  Medication Sig Dispense Refill Last Dose  . amLODipine (NORVASC) 10 MG tablet TAKE 1 TABLET BY MOUTH DAILY. (Patient taking differently: Take 10 mg by mouth daily. ) 90 tablet 1 01/24/2020 at Unknown time  . aspirin 81 MG tablet Take 81 mg by mouth daily.   01/24/2020 at Unknown time  . blood glucose meter kit and supplies KIT Dispense based on patient and insurance preference. Use up to four times daily as directed. (FOR ICD-9 250.00, 250.01). 1 each 0 01/24/2020 at Unknown time   . clopidogrel (PLAVIX) 75 MG tablet TAKE 1 TABLET BY MOUTH DAILY. GENERIC EQUIVALENT FOR FOR PLAVIX. (Patient taking differently: Take 75 mg by mouth daily. ) 30 tablet 2 01/24/2020 at 0900  . isosorbide mononitrate (IMDUR) 30 MG 24 hr tablet Take 1 tablet (30 mg total) by mouth daily. 30 tablet 6 01/24/2020 at Unknown time  . metFORMIN (GLUCOPHAGE-XR) 750 MG 24 hr tablet Take 1 tablet (750 mg total) by mouth 2 (two) times daily. Generic equivalent for Glucophage XR 90 tablet 1 01/24/2020 at Unknown time  . metoprolol tartrate (LOPRESSOR) 50 MG tablet Take 1 tablet (50 mg total) by mouth 2 (two) times daily. 180 tablet 1 01/24/2020 at 0900  . nitroGLYCERIN (NITROSTAT) 0.4 MG SL tablet ONE TAB UNDER TONGUE EVERY 5 MINS--DONT EXCEED 3 TABLETS WITHIN 15 MINUTES-CALL 911 (Patient taking differently: Place 0.4 mg under the tongue every 5 (five) minutes as needed for chest pain. ) 50 tablet 5 01/23/2020  . atorvastatin (LIPITOR) 10 MG tablet Take 1 tablet (10 mg total) by mouth daily. For cholesterol (Patient not taking: Reported on 01/25/2020) 90 tablet 1 Not Taking at Unknown time  . Continuous Blood Gluc Receiver (FREESTYLE LIBRE 14 DAY READER) DEVI 1 each by Does not apply route every 14 (fourteen) days. Dx E11.69 2 each 2   . Continuous Blood Gluc Sensor (FREESTYLE LIBRE 14 DAY SENSOR) MISC USE AS DIRECTED; Dx E11.69 2 each 2    Scheduled:  .  aspirin EC  81 mg Oral Daily  . atorvastatin  10 mg Oral q1800  . clopidogrel  75 mg Oral Daily  . insulin aspart  0-6 Units Subcutaneous TID WC  . isosorbide mononitrate  30 mg Oral Daily  . metoprolol tartrate  50 mg Oral BID   Infusions:  . heparin 1,000 Units/hr (01/25/20 1900)   PRN: acetaminophen, nitroGLYCERIN, ondansetron (ZOFRAN) IV Anti-infectives (From admission, onward)   None      Assessment: Patrick Potts a 86 y.o. male on hep gtt for ACS - no AC PTA. Patient's family considering cardiac cath -heparin level at goal -hg= 11.4   Goal of  Therapy:  Heparin level 0.3-0.7 units/ml Monitor platelets by anticoagulation protocol: Yes   Plan:  -No heparin changes needed -Daily heparin level and CBC  Hildred Laser, PharmD Clinical Pharmacist **Pharmacist phone directory can now be found on [email or web address] ([email or web address]).  Listed under [place].

## 2020-01-27 DIAGNOSIS — R9431 Abnormal electrocardiogram [ECG] [EKG]: Secondary | ICD-10-CM

## 2020-01-27 LAB — CBC
HCT: 36.3 % — ABNORMAL LOW (ref 39.0–52.0)
Hemoglobin: 11.9 g/dL — ABNORMAL LOW (ref 13.0–17.0)
MCH: 31.5 pg (ref 26.0–34.0)
MCHC: 32.8 g/dL (ref 30.0–36.0)
MCV: 96 fL (ref 80.0–100.0)
Platelets: 140 10*3/uL — ABNORMAL LOW (ref 150–400)
RBC: 3.78 MIL/uL — ABNORMAL LOW (ref 4.22–5.81)
RDW: 14.1 % (ref 11.5–15.5)
WBC: 6.6 10*3/uL (ref 4.0–10.5)
nRBC: 0 % (ref 0.0–0.2)

## 2020-01-27 LAB — GLUCOSE, CAPILLARY
Glucose-Capillary: 101 mg/dL — ABNORMAL HIGH (ref 70–99)
Glucose-Capillary: 111 mg/dL — ABNORMAL HIGH (ref 70–99)
Glucose-Capillary: 134 mg/dL — ABNORMAL HIGH (ref 70–99)
Glucose-Capillary: 176 mg/dL — ABNORMAL HIGH (ref 70–99)

## 2020-01-27 LAB — HEPARIN LEVEL (UNFRACTIONATED): Heparin Unfractionated: 0.46 IU/mL (ref 0.30–0.70)

## 2020-01-27 MED ORDER — MELATONIN 3 MG PO TABS
6.0000 mg | ORAL_TABLET | Freq: Every day | ORAL | Status: DC
  Administered 2020-01-27: 6 mg via ORAL
  Filled 2020-01-27 (×2): qty 2

## 2020-01-27 MED ORDER — MAGNESIUM SULFATE 2 GM/50ML IV SOLN
2.0000 g | Freq: Once | INTRAVENOUS | Status: AC
  Administered 2020-01-27: 2 g via INTRAVENOUS
  Filled 2020-01-27: qty 50

## 2020-01-27 MED ORDER — OLANZAPINE 5 MG PO TBDP: 5.0000 mg | ORAL_TABLET | Freq: Every day | ORAL | Status: DC

## 2020-01-27 MED ORDER — LORAZEPAM 2 MG/ML IJ SOLN
0.5000 mg | Freq: Once | INTRAMUSCULAR | Status: AC
  Administered 2020-01-27: 0.5 mg via INTRAVENOUS
  Filled 2020-01-27: qty 1

## 2020-01-27 MED ORDER — HYDROXYZINE HCL 10 MG PO TABS: 10.0000 mg | ORAL_TABLET | Freq: Three times a day (TID) | ORAL | Status: DC | PRN

## 2020-01-27 MED ORDER — OLANZAPINE 5 MG PO TBDP: 5.0000 mg | ORAL_TABLET | Freq: Two times a day (BID) | ORAL | Status: DC | PRN

## 2020-01-27 MED ORDER — OLANZAPINE 5 MG PO TBDP
2.5000 mg | ORAL_TABLET | Freq: Two times a day (BID) | ORAL | Status: DC | PRN
  Administered 2020-01-27: 2.5 mg via ORAL
  Filled 2020-01-27 (×2): qty 0.5

## 2020-01-27 NOTE — Progress Notes (Signed)
Progress Note  Patient Name: Patrick Potts Date of Encounter: 01/27/2020  Primary Cardiologist: No primary care provider on file.   Subjective   Patient calm, but picking at his telemetry and fidgeting. Son Link Snuffer present today, discussed with him.   Inpatient Medications    Scheduled Meds: . aspirin EC  81 mg Oral Daily  . clopidogrel  75 mg Oral Daily  . insulin aspart  0-6 Units Subcutaneous TID WC  . isosorbide mononitrate  30 mg Oral Daily  . metoprolol succinate  100 mg Oral Daily   Continuous Infusions: . heparin 1,000 Units/hr (01/27/20 0554)   PRN Meds: acetaminophen, nitroGLYCERIN, ondansetron (ZOFRAN) IV   Vital Signs    Vitals:   01/26/20 1727 01/26/20 2055 01/27/20 0202 01/27/20 0523  BP: 129/78 113/80 123/85 125/68  Pulse: (!) 58 (!) 56  62  Resp:    19  Temp:  97.8 F (36.6 C)  98 F (36.7 C)  TempSrc:  Oral  Oral  SpO2: 91% 98% 97% 96%  Weight:    72.4 kg  Height:        Intake/Output Summary (Last 24 hours) at 01/27/2020 1449 Last data filed at 01/27/2020 0700 Gross per 24 hour  Intake 237.41 ml  Output 400 ml  Net -162.59 ml   Last 3 Weights 01/27/2020 01/26/2020 01/24/2020  Weight (lbs) 159 lb 9.8 oz 157 lb 10.1 oz 157 lb 8 oz  Weight (kg) 72.4 kg 71.5 kg 71.442 kg      Telemetry    Afib, PVCs - Personally Reviewed  ECG    Afib, RBBB, anterior infarct, artifact 01/25/20- Personally Reviewed  Physical Exam   Constitutional: No acute distress Eyes: sclera non-icteric, normal conjunctiva and lids ENMT: moist mucous membranes Cardiovascular: irregular rhythm, normal rate, no murmurs. S1 and S2 normal. Radial pulses normal bilaterally. No jugular venous distention.  Respiratory: clear to auscultation bilaterally GI : normal bowel sounds, soft and nontender. No distention.   MSK: extremities warm, well perfused. No edema.  NEURO: grossly nonfocal exam, moves all extremities. PSYCH: calm, not oriented, awake, fidgeting with wires and  clothes.   Labs    High Sensitivity Troponin:   Recent Labs  Lab 01/24/20 1535 01/24/20 1730 01/25/20 0839 01/25/20 1204 01/25/20 1348  TROPONINIHS 149* 141* 162* 179* 187*      Chemistry Recent Labs  Lab 01/24/20 1535 01/26/20 0437  NA 141 141  K 4.1 3.8  CL 107 107  CO2 21* 23  GLUCOSE 201* 150*  BUN 39* 36*  CREATININE 1.24 1.34*  CALCIUM 9.8 9.3  PROT 7.2  --   ALBUMIN 4.3  --   AST 42*  --   ALT 32  --   ALKPHOS 72  --   BILITOT 2.7*  --   GFRNONAA 50* 46*  GFRAA 58* 53*  ANIONGAP 13 11     Hematology Recent Labs  Lab 01/24/20 1535 01/26/20 0437 01/27/20 0330  WBC 8.3 5.5 6.6  RBC 4.19* 3.60* 3.78*  HGB 13.2 11.4* 11.9*  HCT 41.0 35.9* 36.3*  MCV 97.9 99.7 96.0  MCH 31.5 31.7 31.5  MCHC 32.2 31.8 32.8  RDW 14.1 14.1 14.1  PLT 149* 132* 140*    BNPNo results for input(s): BNP, PROBNP in the last 168 hours.   DDimer No results for input(s): DDIMER in the last 168 hours.   Radiology    ECHOCARDIOGRAM COMPLETE  Result Date: 01/25/2020    ECHOCARDIOGRAM REPORT   Patient Name:  Patrick Potts Date of Exam: 01/25/2020 Medical Rec #:  [id number]       Height:       69.0 in Accession #:    [id number]      Weight:       157.5 lb Date of Birth:  Jun 05, 1925      BSA:          1.867 m Patient Age:    86 years        BP:           114/77 mmHg Patient Gender: M               HR:           69 bpm. Exam Location:  Inpatient Procedure: 2D Echo, Cardiac Doppler and Color Doppler Indications:    R07.89 Other chest pain  History:        Patient has no prior history of Echocardiogram examinations. 86                 year-old with sudden onset altered mental status.  Sonographer:    Roosvelt Maser RDCS Referring Phys: [id number] [address] [facility] IMPRESSIONS  1. Left ventricular ejection fraction, by estimation, is 20 to 25%. The left ventricle has severely decreased function. The left ventricle demonstrates regional wall motion abnormalities (see scoring diagram/findings  for description). The left ventricular internal cavity size was mildly to moderately dilated. Left ventricular diastolic parameters are indeterminate.  2. Right ventricular systolic function is moderately reduced. The right ventricular size is mildly enlarged. There is moderately elevated pulmonary artery systolic pressure. The estimated right ventricular systolic pressure is 48.6 mmHg.  3. Left atrial size was severely dilated.  4. Right atrial size was severely dilated.  5. The mitral valve is abnormal. Moderate mitral valve regurgitation. No evidence of mitral stenosis.  6. Tricuspid valve regurgitation is moderate.  7. The aortic valve is abnormal - severely calcified with severely restricted leaflet excursion. Aortic valve regurgitation is trivial. At least moderate-severe aortic valve stenosis, low flow low gradient. Dimensionless index is 0.19. Aortic valve mean  gradient measures 8.0 mmHg in the setting of severely reduced cardiac output.  8. The inferior vena cava is dilated in size with <50% respiratory variability, suggesting right atrial pressure of 15 mmHg. Comparison(s): No prior Echocardiogram. FINDINGS  Left Ventricle: Left ventricular ejection fraction, by estimation, is 20 to 25%. The left ventricle has severely decreased function. The left ventricle demonstrates regional wall motion abnormalities. Definity contrast agent was given IV to delineate the left ventricular endocardial borders. The left ventricular internal cavity size was mildly to moderately dilated. There is no left ventricular hypertrophy. Left ventricular diastolic parameters are indeterminate.  LV Wall Scoring: Severe global hypokinesis with akinesis of the septum. Right Ventricle: The right ventricular size is mildly enlarged. No increase in right ventricular wall thickness. Right ventricular systolic function is moderately reduced. There is moderately elevated pulmonary artery systolic pressure. The tricuspid regurgitant velocity  is 2.90 m/s, and with an assumed right atrial pressure of 15 mmHg, the estimated right ventricular systolic pressure is 48.6 mmHg. Left Atrium: Left atrial size was severely dilated. Right Atrium: Right atrial size was severely dilated. Pericardium: Trivial pericardial effusion is present. The pericardial effusion is posterior to the left ventricle. Mitral Valve: The mitral valve is abnormal. There is mild thickening of the mitral valve leaflet(s). Normal mobility of the mitral valve leaflets. Moderate mitral valve regurgitation. No evidence of mitral valve stenosis. Tricuspid Valve:  The tricuspid valve is normal in structure. Tricuspid valve regurgitation is moderate . No evidence of tricuspid stenosis. Aortic Valve: The aortic valve is abnormal. Aortic valve regurgitation is trivial. Severe aortic stenosis is present. There is severe calcifcation of the aortic valve. Aortic valve mean gradient measures 8.0 mmHg. Aortic valve peak gradient measures 9.7 mmHg. Aortic valve area, by VTI measures 0.73 cm. Pulmonic Valve: The pulmonic valve was normal in structure. Pulmonic valve regurgitation is mild. No evidence of pulmonic stenosis. Aorta: The aortic root is normal in size and structure. Venous: The inferior vena cava is dilated in size with less than 50% respiratory variability, suggesting right atrial pressure of 15 mmHg. IAS/Shunts: No atrial level shunt detected by color flow Doppler. EKG: Rhythm strip during this exam demostrated atrial fibrillation.  LEFT VENTRICLE PLAX 2D LVIDd:         6.00 cm LVIDs:         5.20 cm LV PW:         1.10 cm LV IVS:        1.00 cm LVOT diam:     2.20 cm LV SV:         28 LV SV Index:   15 LVOT Area:     3.80 cm  LV Volumes (MOD) LV vol d, MOD A2C: 145.0 ml LV vol d, MOD A4C: 103.0 ml LV vol s, MOD A2C: 95.7 ml LV vol s, MOD A4C: 69.4 ml LV SV MOD A2C:     49.3 ml LV SV MOD A4C:     103.0 ml LV SV MOD BP:      38.4 ml RIGHT VENTRICLE          IVC RV Basal diam:  4.90 cm  IVC  diam: 2.80 cm RV Mid diam:    3.90 cm TAPSE (M-mode): 1.0 cm LEFT ATRIUM              Index       RIGHT ATRIUM           Index LA diam:        4.20 cm  2.25 cm/m  RA Area:     25.60 cm LA Vol (A2C):   104.0 ml 55.72 ml/m RA Volume:   88.60 ml  47.47 ml/m LA Vol (A4C):   104.0 ml 55.72 ml/m LA Biplane Vol: 107.0 ml 57.33 ml/m  AORTIC VALVE AV Area (Vmax):    1.15 cm AV Area (Vmean):   1.07 cm AV Area (VTI):     0.73 cm AV Vmax:           156.00 cm/s AV Vmean:          104.000 cm/s AV VTI:            0.379 m AV Peak Grad:      9.7 mmHg AV Mean Grad:      8.0 mmHg LVOT Vmax:         47.10 cm/s LVOT Vmean:        29.400 cm/s LVOT VTI:          0.072 m LVOT/AV VTI ratio: 0.19  AORTA Ao Root diam: 3.70 cm Ao Asc diam:  3.70 cm MR Peak grad:    84.3 mmHg   TRICUSPID VALVE MR Mean grad:    48.0 mmHg   TR Peak grad:   33.6 mmHg MR Vmax:         459.00 cm/s TR Vmax:  290.00 cm/s MR Vmean:        320.0 cm/s MR PISA:         1.57 cm    SHUNTS MR PISA Eff ROA: 11 mm      Systemic VTI:  0.07 m MR PISA Radius:  0.50 cm     Systemic Diam: 2.20 cm Patrick BrassGayatri Stephanye Finnicum MD Electronically signed by Patrick BrassGayatri Kashten Gowin MD Signature Date/Time: 01/25/2020/9:55:29 PM    Final     Cardiac Studies  Echo 1. Left ventricular ejection fraction, by estimation, is 20 to 25%. The  left ventricle has severely decreased function. The left ventricle  demonstrates regional wall motion abnormalities (see scoring  diagram/findings for description). The left  ventricular internal cavity size was mildly to moderately dilated. Left  ventricular diastolic parameters are indeterminate.  2. Right ventricular systolic function is moderately reduced. The right  ventricular size is mildly enlarged. There is moderately elevated  pulmonary artery systolic pressure. The estimated right ventricular  systolic pressure is 48.6 mmHg.  3. Left atrial size was severely dilated.  4. Right atrial size was severely dilated.  5. The mitral valve  is abnormal. Moderate mitral valve regurgitation. No  evidence of mitral stenosis.  6. Tricuspid valve regurgitation is moderate.  7. The aortic valve is abnormal - severely calcified with severely  restricted leaflet excursion. Aortic valve regurgitation is trivial. At  least moderate-severe aortic valve stenosis, low flow low gradient.  Dimensionless index is 0.19. Aortic valve mean  gradient measures 8.0 mmHg in the setting of severely reduced cardiac  output.  8. The inferior vena cava is dilated in size with <50% respiratory  variability, suggesting right atrial pressure of 15 mmHg.   Patient Profile     [age] y.o. male with a hx of ASCAD. S/P PCI with BMS to ostial RCA 04/2006, PCI/Rotablator DES to LAD/Diag 06/12/2006 and known occluded LCx/OM small vessel with normal LVF.  He also has a hx of PAF on chronic anticoagulation for CHADS2VASC score of 4. Alzheimer's dementia now with hallucinations at night suggestive of sundowning.   Assessment & Plan   1.  Chest pain - Severely reduced EF of 20% and probable severe AS.  - Dementia limits interventional options given issues with patient agitation and sundowning.   -Patient himself shared with me that he would just as soon have nothing done for his heart because he does not feel particularly bothered by the symptoms.  This was a conversation we had during a more lucid moment on the second day of his admission. -Focus will be on symptom management.  He is on Imdur 30 mg daily.  It will be challenging to uptitrate this in the setting of a stenotic lesion of at least moderate severity of the aortic valve, hemodynamics may not favor up titration for the benefit of chest pain.  It may be best that he continue to take sublingual nitro for chest pain as needed. -His family spoke at length as a group (patient's second wife and patient's 7 sons) and are in agreement that he should be taken home.  We have discussed multiple times the interventional  options for investigating reduced ejection fraction and aortic stenosis, and these are not in line with the patient's goals of care.  2.  ASCAD -S/P PCI with BMS to ostial RCA 04/2006, PCI/Rotablator DES to LAD/Diag 06/12/2006 and known occluded LCx/OM small vessel with normal LVF. -continue ASA, plavix, BB, long acting nitrate,  -Due to diffuse joint pain the patient statin  was stopped almost a year ago.  We will remove this from his medication profile. -At this time it is reasonable to continue his other medical therapy for secondary prevention of CAD. -With his newly reduced ejection fraction he may eventually need Lasix, however today appears euvolemic.  I will have him seen in [place] by my partner Dr. Minus Breeding soon for cardiovascular symptom management.  If the patient chooses to pursue palliative care only, this can be managed further by their service.  3.  HLD Discontinue statin in the setting of intolerance noted 1 year ago.  4.  HTN -BP controlled -continue Lopressor 50mg  BID -Would stop amlodipine unless the patient demonstrates hypertension, for medication reconciliation and slightly higher blood pressures in the setting of low flow aortic stenosis.  5.  DM2 -followed by PCP -continue metformin -per TRH  6.  Alzheimer's dementia - behavior disturbance, hallucinations, and confusion.  - this may limit our ability to safely perform procedures to investigate low EF  7.  Chronic atrial fibrillation -CHADS2VASC score 4 but currently not on anticoagulation - patient has declined in the past -continue metoprolol 50mg  BID for rate control   At this time cardiology will sign off.  I have made a follow-up appointment in [place] with Dr. Percival Spanish and I have discussed this patient's care briefly with Dr. Percival Spanish so we can continue to focus on cardiovascular symptom management.  It is reasonable to continue the patient's other cardiovascular medications as noted above for  secondary prevention of CAD and medical treatment of heart failure with reduced ejection fraction.  Please do not hesitate to contact our service for final recommendations if patient remains in hospital.       For questions or updates, please contact [place] Please consult [email or web address] for contact info under        Signed, Elouise Munroe, MD  01/27/2020, 2:49 PM

## 2020-01-27 NOTE — Progress Notes (Signed)
Patient and son informed that palliative is unable to see patient today, but will be into discuss palliative care tomorrow and then patient should be discharged to home.  Patient became upset an hour later and attempted to get out of bed stated he was going home. Sons and nursing spoke to patient and he agreed to stay one more night. Son will be staying with him tonight.

## 2020-01-27 NOTE — Progress Notes (Addendum)
PROGRESS NOTE    Patrick Potts  POE:[id number]  DOB: 1925/07/21  PCP: Claretta Fraise, MD Admit date:01/24/2020  86 y.o.malewith a hx of HTN, CAD, S/P PCI to RCA/LAD and known occluded LCx,  hx of PAF on chronic anticoagulation, mild Alzheimer's dementia brought in by family due to c/o exertional chest pain and visual hallucinations-He thought he saw someone sitting on achair who actually was not there. He was alert, oriented and communicative when seen in ED, apparently ambulates without assistive device and still drives.   ED Course: Afebrile, stable vitals.Hstroponin149 > 141,EKG showing atrial fibrillation, unchanged from prior. QTC 561. Unremarkable CBC, BMP. Portable chest x-ray cardiomegaly with mild vascular congestion. Head CT unremarkable. EDPtalked to cardiology on-call Dr. Radford Pax, recommended admission to [facility], start heparin drip Hospital course: Patient admitted to [facility] for further evaluation and management with cardiology consultation. Work up revealed new systolic cardiomyopathy with Severely reduced EF of 20% and probable severe AS. Dementia and his current care goals limit interventional options including TAVR. Medical management recommended with ASA, plavix, BB, nitrates (without dose increase due to AS) and statins. Infectious w/u for behavioral disturbances/hallucinations unremarkable and symptomatic management for dementia related behavioral changes being contemplated including Seroquel but concern for prolonged Qtc and cardiac risks. Palliative care consulted for further assistance in symptom management and care goal delineation.  Subjective:  Patient resting comfortably.  Appears to be in pleasant mood.  Oriented to place, person and year but not month.  Son Patrick Potts at bedside.  According to son, patient did have hallucinations last night and early this morning but overall improved.  Patient has 7 sons and lives with his 2nd wife.  Objective: Vitals:   01/26/20 1727 01/26/20 2055 01/27/20 0202 01/27/20 0523  BP: 129/78 113/80 123/85 125/68  Pulse: (!) 58 (!) 56  62  Resp:    19  Temp:  97.8 F (36.6 C)  98 F (36.7 C)  TempSrc:  Oral  Oral  SpO2: 91% 98% 97% 96%  Weight:    72.4 kg  Height:        Intake/Output Summary (Last 24 hours) at 01/27/2020 1809 Last data filed at 01/27/2020 0700 Gross per 24 hour  Intake 237.41 ml  Output 400 ml  Net -162.59 ml   Filed Weights   01/24/20 2214 01/26/20 0244 01/27/20 0523  Weight: 71.4 kg 71.5 kg 72.4 kg    Physical Examination:  General exam: Appears calm and comfortable  Respiratory system: Clear to auscultation. Respiratory effort normal. Cardiovascular system: S1 & S2 heard, RRR. No JVD, + systolic ejection murmur, No pedal edema. Gastrointestinal system: Abdomen is nondistended, soft and nontender. Normal bowel sounds heard. Central nervous system: Alert and oriented x2. No new focal neurological deficits. Extremities: No contractures, edema or joint deformities.  Skin: No rashes, lesions or ulcers Psychiatry: Judgement and insight appear normal. Mood & affect appropriate.   Data Reviewed: I have personally reviewed following labs and imaging studies  CBC: Recent Labs  Lab 01/24/20 1535 01/26/20 0437 01/27/20 0330  WBC 8.3 5.5 6.6  NEUTROABS 6.5  --   --   HGB 13.2 11.4* 11.9*  HCT 41.0 35.9* 36.3*  MCV 97.9 99.7 96.0  PLT 149* 132* 315*   Basic Metabolic Panel: Recent Labs  Lab 01/24/20 1535 01/26/20 0437  NA 141 141  K 4.1 3.8  CL 107 107  CO2 21* 23  GLUCOSE 201* 150*  BUN 39* 36*  CREATININE 1.24 1.34*  CALCIUM 9.8 9.3  MG  1.6*  --    GFR: Estimated Creatinine Clearance: 35.2 mL/min (A) (by C-G formula based on SCr of 1.34 mg/dL (H)). Liver Function Tests: Recent Labs  Lab 01/24/20 1535  AST 42*  ALT 32  ALKPHOS 72  BILITOT 2.7*  PROT 7.2  ALBUMIN 4.3   No results for input(s): LIPASE, AMYLASE in the last 168 hours. No results for  input(s): AMMONIA in the last 168 hours. Coagulation Profile: No results for input(s): INR, PROTIME in the last 168 hours. Cardiac Enzymes: No results for input(s): CKTOTAL, CKMB, CKMBINDEX, TROPONINI in the last 168 hours. BNP (last 3 results) No results for input(s): PROBNP in the last 8760 hours. HbA1C: Recent Labs    01/26/20 0437  HGBA1C 7.1*   CBG: Recent Labs  Lab 01/26/20 1648 01/26/20 2056 01/27/20 0809 01/27/20 1119 01/27/20 1656  GLUCAP 103* 124* 101* 111* 134*   Lipid Profile: Recent Labs    01/26/20 0437  CHOL 157  HDL 48  LDLCALC 102*  TRIG 37  CHOLHDL 3.3   Thyroid Function Tests: No results for input(s): TSH, T4TOTAL, FREET4, T3FREE, THYROIDAB in the last 72 hours. Anemia Panel: No results for input(s): VITAMINB12, FOLATE, FERRITIN, TIBC, IRON, RETICCTPCT in the last 72 hours. Sepsis Labs: No results for input(s): PROCALCITON, LATICACIDVEN in the last 168 hours.  Recent Results (from the past 240 hour(s))  SARS CORONAVIRUS 2 (TAT 6-24 HRS) Nasopharyngeal Nasopharyngeal Swab     Status: None   Collection Time: 01/24/20  7:41 PM   Specimen: Nasopharyngeal Swab  Result Value Ref Range Status   SARS Coronavirus 2 NEGATIVE NEGATIVE Final    Comment: (NOTE) SARS-CoV-2 target nucleic acids are NOT DETECTED. The SARS-CoV-2 RNA is generally detectable in upper and lower respiratory specimens during the acute phase of infection. Negative results do not preclude SARS-CoV-2 infection, do not rule out co-infections with other pathogens, and should not be used as the sole basis for treatment or other patient management decisions. Negative results must be combined with clinical observations, patient history, and epidemiological information. The expected result is Negative. Fact Sheet for Patients: HairSlick.no Fact Sheet for Healthcare Providers: quierodirigir.com This test is not yet approved or cleared  by the [place] FDA and  has been authorized for detection and/or diagnosis of SARS-CoV-2 by FDA under an Emergency Use Authorization (EUA). This EUA will remain  in effect (meaning this test can be used) for the duration of the COVID-19 declaration under Section 56 4(b)(1) of the Act, 21 U.S.C. section 360bbb-3(b)(1), unless the authorization is terminated or revoked sooner. Performed at [facility], [address] [place], [place] [address]       Radiology Studies: ECHOCARDIOGRAM COMPLETE  Result Date: 01/25/2020    ECHOCARDIOGRAM REPORT   Patient Name:   JAMARL PEW Date of Exam: 01/25/2020 Medical Rec #:  [id number]       Height:       69.0 in Accession #:    [id number]      Weight:       157.5 lb Date of Birth:  1925/04/25      BSA:          1.867 m Patient Age:    89 years        BP:           114/77 mmHg Patient Gender: M               HR:  69 bpm. Exam Location:  Inpatient Procedure: 2D Echo, Cardiac Doppler and Color Doppler Indications:    R07.89 Other chest pain  History:        Patient has no prior history of Echocardiogram examinations. 86                 year-old with sudden onset altered mental status.  Sonographer:    Roosvelt Maser RDCS Referring Phys: [address] [facility] IMPRESSIONS  1. Left ventricular ejection fraction, by estimation, is 20 to 25%. The left ventricle has severely decreased function. The left ventricle demonstrates regional wall motion abnormalities (see scoring diagram/findings for description). The left ventricular internal cavity size was mildly to moderately dilated. Left ventricular diastolic parameters are indeterminate.  2. Right ventricular systolic function is moderately reduced. The right ventricular size is mildly enlarged. There is moderately elevated pulmonary artery systolic pressure. The estimated right ventricular systolic pressure is 48.6 mmHg.  3. Left atrial size was severely dilated.  4. Right atrial size was severely  dilated.  5. The mitral valve is abnormal. Moderate mitral valve regurgitation. No evidence of mitral stenosis.  6. Tricuspid valve regurgitation is moderate.  7. The aortic valve is abnormal - severely calcified with severely restricted leaflet excursion. Aortic valve regurgitation is trivial. At least moderate-severe aortic valve stenosis, low flow low gradient. Dimensionless index is 0.19. Aortic valve mean  gradient measures 8.0 mmHg in the setting of severely reduced cardiac output.  8. The inferior vena cava is dilated in size with <50% respiratory variability, suggesting right atrial pressure of 15 mmHg. Comparison(s): No prior Echocardiogram. FINDINGS  Left Ventricle: Left ventricular ejection fraction, by estimation, is 20 to 25%. The left ventricle has severely decreased function. The left ventricle demonstrates regional wall motion abnormalities. Definity contrast agent was given IV to delineate the left ventricular endocardial borders. The left ventricular internal cavity size was mildly to moderately dilated. There is no left ventricular hypertrophy. Left ventricular diastolic parameters are indeterminate.  LV Wall Scoring: Severe global hypokinesis with akinesis of the septum. Right Ventricle: The right ventricular size is mildly enlarged. No increase in right ventricular wall thickness. Right ventricular systolic function is moderately reduced. There is moderately elevated pulmonary artery systolic pressure. The tricuspid regurgitant velocity is 2.90 m/s, and with an assumed right atrial pressure of 15 mmHg, the estimated right ventricular systolic pressure is 48.6 mmHg. Left Atrium: Left atrial size was severely dilated. Right Atrium: Right atrial size was severely dilated. Pericardium: Trivial pericardial effusion is present. The pericardial effusion is posterior to the left ventricle. Mitral Valve: The mitral valve is abnormal. There is mild thickening of the mitral valve leaflet(s). Normal  mobility of the mitral valve leaflets. Moderate mitral valve regurgitation. No evidence of mitral valve stenosis. Tricuspid Valve: The tricuspid valve is normal in structure. Tricuspid valve regurgitation is moderate . No evidence of tricuspid stenosis. Aortic Valve: The aortic valve is abnormal. Aortic valve regurgitation is trivial. Severe aortic stenosis is present. There is severe calcifcation of the aortic valve. Aortic valve mean gradient measures 8.0 mmHg. Aortic valve peak gradient measures 9.7 mmHg. Aortic valve area, by VTI measures 0.73 cm. Pulmonic Valve: The pulmonic valve was normal in structure. Pulmonic valve regurgitation is mild. No evidence of pulmonic stenosis. Aorta: The aortic root is normal in size and structure. Venous: The inferior vena cava is dilated in size with less than 50% respiratory variability, suggesting right atrial pressure of 15 mmHg. IAS/Shunts: No atrial level shunt detected by color  flow Doppler. EKG: Rhythm strip during this exam demostrated atrial fibrillation.  LEFT VENTRICLE PLAX 2D LVIDd:         6.00 cm LVIDs:         5.20 cm LV PW:         1.10 cm LV IVS:        1.00 cm LVOT diam:     2.20 cm LV SV:         28 LV SV Index:   15 LVOT Area:     3.80 cm  LV Volumes (MOD) LV vol d, MOD A2C: 145.0 ml LV vol d, MOD A4C: 103.0 ml LV vol s, MOD A2C: 95.7 ml LV vol s, MOD A4C: 69.4 ml LV SV MOD A2C:     49.3 ml LV SV MOD A4C:     103.0 ml LV SV MOD BP:      38.4 ml RIGHT VENTRICLE          IVC RV Basal diam:  4.90 cm  IVC diam: 2.80 cm RV Mid diam:    3.90 cm TAPSE (M-mode): 1.0 cm LEFT ATRIUM              Index       RIGHT ATRIUM           Index LA diam:        4.20 cm  2.25 cm/m  RA Area:     25.60 cm LA Vol (A2C):   104.0 ml 55.72 ml/m RA Volume:   88.60 ml  47.47 ml/m LA Vol (A4C):   104.0 ml 55.72 ml/m LA Biplane Vol: 107.0 ml 57.33 ml/m  AORTIC VALVE AV Area (Vmax):    1.15 cm AV Area (Vmean):   1.07 cm AV Area (VTI):     0.73 cm AV Vmax:           156.00 cm/s  AV Vmean:          104.000 cm/s AV VTI:            0.379 m AV Peak Grad:      9.7 mmHg AV Mean Grad:      8.0 mmHg LVOT Vmax:         47.10 cm/s LVOT Vmean:        29.400 cm/s LVOT VTI:          0.072 m LVOT/AV VTI ratio: 0.19  AORTA Ao Root diam: 3.70 cm Ao Asc diam:  3.70 cm MR Peak grad:    84.3 mmHg   TRICUSPID VALVE MR Mean grad:    48.0 mmHg   TR Peak grad:   33.6 mmHg MR Vmax:         459.00 cm/s TR Vmax:        290.00 cm/s MR Vmean:        320.0 cm/s MR PISA:         1.57 cm    SHUNTS MR PISA Eff ROA: 11 mm      Systemic VTI:  0.07 m MR PISA Radius:  0.50 cm     Systemic Diam: 2.20 cm Weston Brass MD Electronically signed by Weston Brass MD Signature Date/Time: 01/25/2020/9:55:29 PM    Final         Scheduled Meds: . aspirin EC  81 mg Oral Daily  . clopidogrel  75 mg Oral Daily  . insulin aspart  0-6 Units Subcutaneous TID WC  . isosorbide mononitrate  30 mg Oral Daily  .  metoprolol succinate  100 mg Oral Daily   Continuous Infusions: . heparin 1,000 Units/hr (01/27/20 0554)    Assessment/Plan:  1.  Exertional chest pain:  Patient does have h/o CAD and underwent PTCA x 2. Seen by cardiology and underwent work-up.  Ruled out for MI. Echo showed severely reduced EF of 20% and probable severe AS. continue ASA, plavix, metoprolol, Imdur. D/C heparin drip. Off statins due to intolerance a year ago. Likely aortic stenosis playing a part in exertional symptoms. Cardiology advised not to increase long acting nitrates. Family does not want to pursue aggressive interventions and medical management only at this time.   2. New systolic cardiomyopathy with low EF 25%: Euvolemic currently. Soft Bps. Not on lasix. Not on ACEI due to AKI. Noted that Lopressor 50mg  bid changed to 100 mg XL today. To f/ochrein on discharge per Dr Jacques NavyAcharya.  3. AKI on CKD stage 1: baseline creat appear to be around 1.0 with GFR 60. Currently BUN upto 38 and creat at 1.34. Not on diuretics. Low output with EF 25%  . Monitor for now. Hold off on ACEI. Encourage oral fluid intake to some extent  4. Chronic atrial fibrillation-CHADS2VASC score 4 but currently not on anticoagulation - patient has declined in the past. continue metoprolol 50mg  BID for rate control. D/C heparin drip  5. HTN: Continue metoprolol, Imdur. Norvasc discontinued by cardiology  6. Prolonged Qtc: Keep Mg close to 2 and potassium close to 4 . Received IV mag x 1 today  7. Dementia now with hallucinations/delirium: mostly at night/early morning suggestive of sundowning.Reluctant to prescribe antipsychotics (seroquel/haldol/risperdal) given prolonged qtc.  Can try melatonin qhs and hydroxyzine prn for anxiety and see if improves. Will have low dose zyprexa prn available for agitation/hallucinations with possibly low qt effect as explained to wife   8. Goals of care: I had extensive discussion with son Link Snufferddie at bedside regarding options and above recommendations. Family would like to consider OP palliative care vs hospice based on family discussion with palliative care team in am ( I called [facility] on call and requested to meet with family in am )   DVT prophylaxis: D/C heparin drip and SCD/ted hose Code Status: DNR Family / Patient Communication: D/W son at bedside earlier. Called and updated wife Salome HolmesJoane [phone] Disposition Plan:   Patient is from home prior to hospitalization. Received/Receiving inpatient care for chest pain/dementia/delirium Discharge when seen by palliative care and goals of care delineated , possibly in am. PT for home equipment.   LOS: 1 day    Time spent: 35 minutes    Alessandra BevelsNeelima Treshon Stannard, MD [facility] Pager in [place]  If 7PM-7AM, please contact night-coverage [email or web address] 01/27/2020, 6:09 PM

## 2020-01-27 NOTE — Progress Notes (Signed)
[place] for heparin gtt  Indication: chest pain/ACS  No Known Allergies  Patient Measurements: Height: 5' 9" (175.3 cm) Weight: 159 lb 9.8 oz (72.4 kg) IBW/kg (Calculated) : 70.7 Heparin Dosing Weight: HEPARIN DW (KG): 71.4   Vital Signs: Temp: 98 F (36.7 C) (03/23 0523) Temp Source: Oral (03/23 0523) BP: 125/68 (03/23 0523) Pulse Rate: 62 (03/23 0523)  Labs: Recent Labs    01/24/20 1535 01/24/20 1535 01/25/20 1204 01/26/20 0437 01/27/20 0330  HGB 13.2   < >  --  11.4* 11.9*  HCT 41.0  --   --  35.9* 36.3*  PLT 149*  --   --  132* 140*  HEPARINUNFRC  --   --  0.27* 0.35 0.46  CREATININE 1.24  --   --  1.34*  --    < > = values in this interval not displayed.    Estimated Creatinine Clearance: 35.2 mL/min (A) (by C-G formula based on SCr of 1.34 mg/dL (H)).   Medical History: Past Medical History:  Diagnosis Date  . Alzheimer's type dementia ([place])   . ASCVD (arteriosclerotic cardiovascular disease)    multivessel, s/p PCI/BM stent implant, ostial RCA, 04/30/06---s/p PCI/Rotablator-assisted DE stent implant LAD diagonal 06/12/06----known occlusion LCx OM small vessel--Intact overall LVEF 55-60% on cath 05/03/06  . Back pain    remote history  . Chronic atrial fibrillation (HCC)    CHADS2VASC score 4 - refused anticoagulation in the past  . DM type 2 (diabetes mellitus, type 2) ([place])   . Essential hypertension, benign 04/28/2014  . HLD (hyperlipidemia)   . Myalgia    from statins  . Myocardial infarction [facility])     Medications:  Medications Prior to Admission  Medication Sig Dispense Refill Last Dose  . amLODipine (NORVASC) 10 MG tablet TAKE 1 TABLET BY MOUTH DAILY. (Patient taking differently: Take 10 mg by mouth daily. ) 90 tablet 1 01/24/2020 at Unknown time  . aspirin 81 MG tablet Take 81 mg by mouth daily.   01/24/2020 at Unknown time  . blood glucose meter kit and supplies KIT Dispense based on patient and insurance  preference. Use up to four times daily as directed. (FOR ICD-9 250.00, 250.01). 1 each 0 01/24/2020 at Unknown time  . clopidogrel (PLAVIX) 75 MG tablet TAKE 1 TABLET BY MOUTH DAILY. GENERIC EQUIVALENT FOR FOR PLAVIX. (Patient taking differently: Take 75 mg by mouth daily. ) 30 tablet 2 01/24/2020 at 0900  . isosorbide mononitrate (IMDUR) 30 MG 24 hr tablet Take 1 tablet (30 mg total) by mouth daily. 30 tablet 6 01/24/2020 at Unknown time  . metFORMIN (GLUCOPHAGE-XR) 750 MG 24 hr tablet Take 1 tablet (750 mg total) by mouth 2 (two) times daily. Generic equivalent for Glucophage XR 90 tablet 1 01/24/2020 at Unknown time  . metoprolol tartrate (LOPRESSOR) 50 MG tablet Take 1 tablet (50 mg total) by mouth 2 (two) times daily. 180 tablet 1 01/24/2020 at 0900  . nitroGLYCERIN (NITROSTAT) 0.4 MG SL tablet ONE TAB UNDER TONGUE EVERY 5 MINS--DONT EXCEED 3 TABLETS WITHIN 15 MINUTES-CALL 911 (Patient taking differently: Place 0.4 mg under the tongue every 5 (five) minutes as needed for chest pain. ) 50 tablet 5 01/23/2020  . atorvastatin (LIPITOR) 10 MG tablet Take 1 tablet (10 mg total) by mouth daily. For cholesterol (Patient not taking: Reported on 01/25/2020) 90 tablet 1 Not Taking at Unknown time  . Continuous Blood Gluc Receiver (FREESTYLE LIBRE 14 DAY READER) DEVI 1 each by Does  not apply route every 14 (fourteen) days. Dx E11.69 2 each 2   . Continuous Blood Gluc Sensor (FREESTYLE LIBRE 14 DAY SENSOR) MISC USE AS DIRECTED; Dx E11.69 2 each 2    Scheduled:  . aspirin EC  81 mg Oral Daily  . atorvastatin  40 mg Oral q1800  . clopidogrel  75 mg Oral Daily  . insulin aspart  0-6 Units Subcutaneous TID WC  . isosorbide mononitrate  30 mg Oral Daily  . metoprolol succinate  100 mg Oral Daily   Infusions:  . heparin 1,000 Units/hr (01/27/20 0554)   PRN: acetaminophen, nitroGLYCERIN, ondansetron (ZOFRAN) IV Anti-infectives (From admission, onward)   None      Assessment: Patrick Potts a 86 y.o. male on  hep gtt for ACS - no AC PTA. Appears no plans for procedure. -heparin level at goal -hg= 11.9   Goal of Therapy:  Heparin level 0.3-0.7 units/ml Monitor platelets by anticoagulation protocol: Yes   Plan:  -No heparin changes needed -Daily heparin level and CBC -Will follow plans for length of therapy  Hildred Laser, PharmD Clinical Pharmacist **Pharmacist phone directory can now be found on [email or web address] ([email or web address]).  Listed under [place].

## 2020-01-28 ENCOUNTER — Telehealth: Payer: Self-pay | Admitting: Family Medicine

## 2020-01-28 DIAGNOSIS — G301 Alzheimer's disease with late onset: Secondary | ICD-10-CM

## 2020-01-28 DIAGNOSIS — F0281 Dementia in other diseases classified elsewhere with behavioral disturbance: Secondary | ICD-10-CM

## 2020-01-28 DIAGNOSIS — I5022 Chronic systolic (congestive) heart failure: Secondary | ICD-10-CM

## 2020-01-28 DIAGNOSIS — Z515 Encounter for palliative care: Secondary | ICD-10-CM

## 2020-01-28 DIAGNOSIS — Z7189 Other specified counseling: Secondary | ICD-10-CM

## 2020-01-28 DIAGNOSIS — I350 Nonrheumatic aortic (valve) stenosis: Secondary | ICD-10-CM

## 2020-01-28 LAB — CBC
HCT: 35.3 % — ABNORMAL LOW (ref 39.0–52.0)
Hemoglobin: 11.7 g/dL — ABNORMAL LOW (ref 13.0–17.0)
MCH: 31.5 pg (ref 26.0–34.0)
MCHC: 33.1 g/dL (ref 30.0–36.0)
MCV: 95.1 fL (ref 80.0–100.0)
Platelets: 139 10*3/uL — ABNORMAL LOW (ref 150–400)
RBC: 3.71 MIL/uL — ABNORMAL LOW (ref 4.22–5.81)
RDW: 13.9 % (ref 11.5–15.5)
WBC: 6 10*3/uL (ref 4.0–10.5)
nRBC: 0 % (ref 0.0–0.2)

## 2020-01-28 LAB — MAGNESIUM: Magnesium: 1.8 mg/dL (ref 1.7–2.4)

## 2020-01-28 LAB — GLUCOSE, CAPILLARY
Glucose-Capillary: 127 mg/dL — ABNORMAL HIGH (ref 70–99)
Glucose-Capillary: 113 mg/dL — ABNORMAL HIGH (ref 70–99)
Glucose-Capillary: 115 mg/dL — ABNORMAL HIGH (ref 70–99)
Glucose-Capillary: 171 mg/dL — ABNORMAL HIGH (ref 70–99)

## 2020-01-28 LAB — BASIC METABOLIC PANEL
Anion gap: 12 (ref 5–15)
BUN: 25 mg/dL — ABNORMAL HIGH (ref 8–23)
CO2: 23 mmol/L (ref 22–32)
Calcium: 9 mg/dL (ref 8.9–10.3)
Chloride: 106 mmol/L (ref 98–111)
Creatinine, Ser: 1.3 mg/dL — ABNORMAL HIGH (ref 0.61–1.24)
GFR calc Af Amer: 55 mL/min — ABNORMAL LOW (ref >60)
GFR calc non Af Amer: 47 mL/min — ABNORMAL LOW (ref >60)
Glucose, Bld: 136 mg/dL — ABNORMAL HIGH (ref 70–99)
Potassium: 3.7 mmol/L (ref 3.5–5.1)
Sodium: 141 mmol/L (ref 135–145)

## 2020-01-28 LAB — HEPARIN LEVEL (UNFRACTIONATED): Heparin Unfractionated: <0.1 IU/mL — ABNORMAL LOW (ref 0.30–0.70)

## 2020-01-28 MED ORDER — SODIUM CHLORIDE 0.9 % IV SOLN: INTRAVENOUS | Status: DC

## 2020-01-28 MED ORDER — OLANZAPINE 5 MG PO TBDP
5.0000 mg | ORAL_TABLET | Freq: Every day | ORAL | Status: DC
  Administered 2020-01-28 – 2020-01-29 (×2): 5 mg via ORAL
  Filled 2020-01-28 (×3): qty 1

## 2020-01-28 MED ORDER — MORPHINE SULFATE (CONCENTRATE) 10 MG/0.5ML PO SOLN
2.5000 mg | ORAL | Status: DC | PRN
  Administered 2020-01-29: 2.6 mg via ORAL
  Filled 2020-01-28: qty 0.5

## 2020-01-28 MED ORDER — MAGNESIUM SULFATE 2 GM/50ML IV SOLN
2.0000 g | Freq: Once | INTRAVENOUS | Status: AC
  Administered 2020-01-28: 2 g via INTRAVENOUS
  Filled 2020-01-28: qty 50

## 2020-01-28 MED ORDER — LORAZEPAM 0.5 MG PO TABS: 0.2500 mg | ORAL_TABLET | Freq: Every day | ORAL | Status: DC | PRN

## 2020-01-28 MED ORDER — OLANZAPINE 5 MG PO TBDP
5.0000 mg | ORAL_TABLET | Freq: Every day | ORAL | Status: DC | PRN
  Administered 2020-01-28: 5 mg via ORAL
  Filled 2020-01-28 (×3): qty 1

## 2020-01-28 NOTE — Evaluation (Signed)
PT Cancellation Note  Patient Details Name: Patrick Potts MRN: [id number] DOB: 1924/12/27   Cancelled Treatment:    Reason Eval/Treat Not Completed: Patient's level of consciousness. Orders received and chart reviewed, have attempted to see pt multiple times this day but each time spt as been asleep and not arouseable. Nurse and family report he had Ativan last night and hence sleepiness this day. Will f/u tomorrow morning to complete PT Evaluation.  Drema Pry, PT    Freddi Starr 01/28/2020, 3:37 PM

## 2020-01-28 NOTE — Progress Notes (Addendum)
PROGRESS NOTE    Patrick Potts  JIR:[id number]  DOB: 1925-01-28  PCP: Patrick Fraise, MD Admit date:01/24/2020  86 y.o.malewith a hx of HTN, CAD, S/P PCI to RCA/LAD and known occluded LCx,  hx of PAF on chronic anticoagulation, mild Alzheimer's dementia brought in by family due to c/o exertional chest pain and visual hallucinations-He thought he saw someone sitting on achair who actually was not there. He was alert, oriented and communicative when seen in ED, apparently ambulates without assistive device and still drives.   ED Course: Afebrile, stable vitals.Hstroponin149 > 141,EKG showing atrial fibrillation, unchanged from prior. QTC 561. Unremarkable CBC, BMP. Portable chest x-ray cardiomegaly with mild vascular congestion. Head CT unremarkable. EDPtalked to cardiology on-call Dr. Radford Pax, recommended admission to [facility], start heparin drip Hospital course: Patient admitted to [facility] for further evaluation and management with cardiology consultation. Work up revealed new systolic cardiomyopathy with Severely reduced EF of 20% and probable severe AS. Dementia and his current care goals limit interventional options including TAVR. Medical management recommended with ASA, plavix, BB, nitrates (without dose increase due to AS) and statins. Infectious w/u for behavioral disturbances/hallucinations unremarkable and symptomatic management for dementia related behavioral changes being contemplated including Seroquel but concern for prolonged Qtc and cardiac risks. Palliative care consulted for further assistance in symptom management and care goal delineation. Patient has 7 sons and lives with his 2nd wife.  Family leaning towards nonaggressive care but also concerned about symptom management at home as apparently threatened to harm wife recently.  Subjective:  Patient apparently had worsening delirium/hallucinations last night-did not respond to Zyprexa and received 2.5 mg IV Ativan around  9:45 PM.  Appears drowsy and more delirious this morning.  Sons Patrick Potts and Patrick Potts at bed side.  Patient was oriented to place, person and year but not month yesterday.    Objective: Vitals:   01/28/20 0500 01/28/20 0837 01/28/20 0840 01/28/20 1410  BP: 120/73 124/80 124/80 124/88  Pulse: 75 73  78  Resp:    17  Temp: 98.6 F (37 C)   97.7 F (36.5 C)  TempSrc: Oral   Oral  SpO2: 99%   96%  Weight:      Height:        Intake/Output Summary (Last 24 hours) at 01/28/2020 1759 Last data filed at 01/28/2020 1500 Gross per 24 hour  Intake 140.3 ml  Output --  Net 140.3 ml   Filed Weights   01/26/20 0244 01/27/20 0523 01/28/20 0231  Weight: 71.5 kg 72.4 kg 71.9 kg    Physical Examination:  General exam: Appears calm and comfortable  Respiratory system: Clear to auscultation. Respiratory effort normal. Cardiovascular system: S1 & S2 heard, RRR. No JVD, + systolic ejection murmur, No pedal edema. Gastrointestinal system: Abdomen is nondistended, soft and nontender. Normal bowel sounds heard. Central nervous system: Somnolent, drowsy.  Easily arousable and was able to name one of his sons.  Intermittently picking in the air.   Extremities: No contractures, edema or joint deformities.  Skin: No rashes, lesions or ulcers Psychiatry: Judgement and insight impaired today Data Reviewed: I have personally reviewed following labs and imaging studies  CBC: Recent Labs  Lab 01/24/20 1535 01/26/20 0437 01/27/20 0330 01/28/20 0354  WBC 8.3 5.5 6.6 6.0  NEUTROABS 6.5  --   --   --   HGB 13.2 11.4* 11.9* 11.7*  HCT 41.0 35.9* 36.3* 35.3*  MCV 97.9 99.7 96.0 95.1  PLT 149* 132* 140* 751*   Basic Metabolic  Panel: Recent Labs  Lab 01/24/20 1535 01/26/20 0437 01/28/20 1026  NA 141 141 141  K 4.1 3.8 3.7  CL 107 107 106  CO2 21* 23 23  GLUCOSE 201* 150* 136*  BUN 39* 36* 25*  CREATININE 1.24 1.34* 1.30*  CALCIUM 9.8 9.3 9.0  MG 1.6*  --  1.8   GFR: Estimated Creatinine  Clearance: 36.3 mL/min (A) (by C-G formula based on SCr of 1.3 mg/dL (H)). Liver Function Tests: Recent Labs  Lab 01/24/20 1535  AST 42*  ALT 32  ALKPHOS 72  BILITOT 2.7*  PROT 7.2  ALBUMIN 4.3   No results for input(s): LIPASE, AMYLASE in the last 168 hours. No results for input(s): AMMONIA in the last 168 hours. Coagulation Profile: No results for input(s): INR, PROTIME in the last 168 hours. Cardiac Enzymes: No results for input(s): CKTOTAL, CKMB, CKMBINDEX, TROPONINI in the last 168 hours. BNP (last 3 results) No results for input(s): PROBNP in the last 8760 hours. HbA1C: Recent Labs    01/26/20 0437  HGBA1C 7.1*   CBG: Recent Labs  Lab 01/27/20 1656 01/27/20 2130 01/28/20 0759 01/28/20 1141 01/28/20 1700  GLUCAP 134* 176* 127* 113* 115*   Lipid Profile: Recent Labs    01/26/20 0437  CHOL 157  HDL 48  LDLCALC 102*  TRIG 37  CHOLHDL 3.3   Thyroid Function Tests: No results for input(s): TSH, T4TOTAL, FREET4, T3FREE, THYROIDAB in the last 72 hours. Anemia Panel: No results for input(s): VITAMINB12, FOLATE, FERRITIN, TIBC, IRON, RETICCTPCT in the last 72 hours. Sepsis Labs: No results for input(s): PROCALCITON, LATICACIDVEN in the last 168 hours.  Recent Results (from the past 240 hour(s))  SARS CORONAVIRUS 2 (TAT 6-24 HRS) Nasopharyngeal Nasopharyngeal Swab     Status: None   Collection Time: 01/24/20  7:41 PM   Specimen: Nasopharyngeal Swab  Result Value Ref Range Status   SARS Coronavirus 2 NEGATIVE NEGATIVE Final    Comment: (NOTE) SARS-CoV-2 target nucleic acids are NOT DETECTED. The SARS-CoV-2 RNA is generally detectable in upper and lower respiratory specimens during the acute phase of infection. Negative results do not preclude SARS-CoV-2 infection, do not rule out co-infections with other pathogens, and should not be used as the sole basis for treatment or other patient management decisions. Negative results must be combined with clinical  observations, patient history, and epidemiological information. The expected result is Negative. Fact Sheet for Patients: SugarRoll.be Fact Sheet for Healthcare Providers: https://www.woods-mathews.com/ This test is not yet approved or cleared by the [place] FDA and  has been authorized for detection and/or diagnosis of SARS-CoV-2 by FDA under an Emergency Use Authorization (EUA). This EUA will remain  in effect (meaning this test can be used) for the duration of the COVID-19 declaration under Section 56 4(b)(1) of the Act, 21 U.S.C. section 360bbb-3(b)(1), unless the authorization is terminated or revoked sooner. Performed at [facility], [place] [address] [place], [place] [address]       Radiology Studies: No results found.      Scheduled Meds: . aspirin EC  81 mg Oral Daily  . clopidogrel  75 mg Oral Daily  . insulin aspart  0-6 Units Subcutaneous TID WC  . isosorbide mononitrate  30 mg Oral Daily  . metoprolol succinate  100 mg Oral Daily  . OLANZapine zydis  5 mg Oral Daily   Continuous Infusions: . sodium chloride 50 mL/hr at 01/28/20 1116    Assessment/Plan:  1. Dementia now with hallucinations/delirium: mostly  at night/early morning suggestive of sundowning. CT head on admission showed atrophy and advanced chronic microvascular ischemic changes.  Reluctant to prescribe antipsychotics (seroquel/haldol/risperdal) given prolonged qtc.  Received melatonin qhs last night along with Zyprexa 2.5 mg x 1 dose for confusion last night with no significant improvement.  Worsened condition with Ativan subsequently.  And hydroxyzine prn for anxiety but he did not receive any yesterday.  Given worsening mental status today, would avoid all benzodiazepines for now.  Continue to utilize zyprexa prn for agitation-noted that palliative care team has increased the dose.  Family understands risks.  Family apparently mentioned to  palliative care nurse that patient had pulled out a gun recently and threatened to harm wife/himself.  Will need as needed medications for family to administer in case of agitation at home.  Will consult psychiatry.  Advised to remove all harmful equipment like guns from home.   2. Exertional chest pain:  Patient does have h/o CAD and underwent PTCA x 2. Seen by cardiology and underwent work-up.  Ruled out for MI. Echo showed severely reduced EF of 20% and probable severe AS. continue ASA, plavix, metoprolol, Imdur. D/C heparin drip. Off statins due to intolerance a year ago. Likely aortic stenosis playing a part in exertional symptoms. Cardiology advised not to increase long acting nitrates. Family does not want to pursue aggressive interventions and medical management only at this time.   3. New systolic cardiomyopathy with low EF 25%: Euvolemic currently. Soft Bps. Not on lasix. Not on ACEI due to AKI. Noted that Lopressor '50mg'$  bid changed to 100 mg XL today. To f/u Dr Percival Spanish on discharge per Dr Margaretann Loveless.  4. AKI on CKD stage 1: baseline creat appear to be around 1.0 with GFR 60. Currently BUN upto 38 and creat at 1.34. Not on diuretics. Low output with EF 25% .  Oral intake has been poor in the last 2 days with confusion.  Will try ginger hydration and labs in a.m.  Hold off on ACEI.   5. HTN: Continue metoprolol, Imdur. Norvasc discontinued by cardiology  6. Prolonged Qtc: Keep Mg close to 2 and potassium close to 4 . Received IV mag x 1 on 3/23 and level improved to 1.8.  Will repeat dosage today.  Repeat EKG shows improvement in QTC to 526 ms.  Continue beta-blockers.  7.Chronic atrial fibrillation-CHADS2VASC score 4 but currently not on anticoagulation - patient has declined in the past. continue metoprolol '50mg'$  BID for rate control.  Now off heparin drip  8. Goals of care: I had extensive discussion with son Ludwig Clarks yesterday and with sons, [name] and [name] today.  They met with palliative care  team and leaning towards home hospice.  They however would like outpatient plan for symptom management.  Appreciate palliative care assistance  DVT prophylaxis: D/C heparin drip and SCD/ted hose Code Status: DNR Family / Patient Communication: D/W son at bedside earlier. Called and updated wife Carylon Perches [phone] Disposition Plan:   Patient is from home prior to hospitalization. Received/Receiving inpatient care for chest pain/dementia/delirium Discharge in a.m. with home palliative care/hospice if symptoms of agitation/delirium improved and manageable at home by family.  DC cardiac monitor for comfort goals     LOS: 2 days    Time spent: 35 minutes    Guilford Shi, MD [facility] Pager in [place]  If 7PM-7AM, please contact night-coverage [email or web address] 01/28/2020, 5:59 PM

## 2020-01-28 NOTE — TOC Initial Note (Addendum)
Transition of Care [facility]) - Initial/Assessment Note    Patient Details  Name: Patrick Potts MRN: [id number] Date of Birth: 1925/07/02  [facility] CM/SW Contact:    Bethena Roys, RN Phone Number: 01/28/2020, 3:28 PM  Clinical Narrative:  Patient presented for Chest Pain- Prior to arrival patient was from home with the support of spouse. Case Manager received consult for home hospice. Case Manager called wife and she wants to use [facility]- patient's wife did not need the Medicare.gov list for choice. Referral made with Hospice Liaison Cassandra for [facility]. Voicemail was left and awaiting a return phone call back. Per wife she will not need any durable medical equipment at this time. She will think on it and make agency aware. Per family patient will transport via private vehicle home. No further needs at this time. Will continue to follow for additional transition of care needs.           1552 01-28-20 Case Manager was able to speak with Cassandra Liaison for [facility] regarding referral. Hospice should be able to accept the patient. Liaison to call back if they cannot support the patient's needs. Case Manager to continue to follow for additional transition of care needs.     Expected Discharge Plan: Home w Hospice Care Barriers to Discharge: No Barriers Identified   Patient Goals and CMS Choice Patient states their goals for this hospitalization and ongoing recovery are:: "to return home" CMS Medicare.gov Compare Post Acute Care list provided to:: Patient Represenative (must comment)(Wife Joann) Choice offered to / list presented to : Spouse  Expected Discharge Plan and Services Expected Discharge Plan: Home w Hospice Care In-house Referral: NA Discharge Planning Services: CM Consult Post Acute Care Choice: Hospice Living arrangements for the past 2 months: Single Family Home        HH Arranged: RN [facility] Agency:  Hospice and [place] Date [place]: 01/28/20 Time [place]: 1525 Representative spoke with at [place]: Edna left  Prior Living Arrangements/Services Living arrangements for the past 2 months: Single Family Home Lives with:: Spouse Patient language and need for interpreter reviewed:: Yes Do you feel safe going back to the place where you live?: Yes      Need for Family Participation in Patient Care: Yes (Comment) Care giver support system in place?: Yes (comment)   Criminal Activity/Legal Involvement Pertinent to Current Situation/Hospitalization: No - Comment as needed  Activities of Daily Living Home Assistive Devices/Equipment: None ADL Screening (condition at time of admission) Patient's cognitive ability adequate to safely complete daily activities?: No Is the patient deaf or have difficulty hearing?: Yes Does the patient have difficulty seeing, even when wearing glasses/contacts?: Yes Does the patient have difficulty concentrating, remembering, or making decisions?: Yes Patient able to express need for assistance with ADLs?: No Does the patient have difficulty dressing or bathing?: Yes Independently performs ADLs?: No Communication: Independent Dressing (OT): Needs assistance Is this a change from baseline?: Pre-admission baseline Grooming: Needs assistance Is this a change from baseline?: Pre-admission baseline Feeding: Independent Bathing: Needs assistance Is this a change from baseline?: Pre-admission baseline Toileting: Needs assistance Is this a change from baseline?: Pre-admission baseline In/Out Bed: Independent Walks in Home: Independent Does the patient have difficulty walking or climbing stairs?: Yes Weakness of Legs: None Weakness of Arms/Hands: None  Permission Sought/Granted Permission sought to share information with : Family Supports, Pharmacist, community Information  with  NAME: Karlyne Greenspan [phone]  Permission granted to share info w AGENCY: [facility]        Emotional Assessment Appearance:: Appears stated age Attitude/Demeanor/Rapport: Engaged Affect (typically observed): Appropriate Orientation: : Oriented to Place, Oriented to Self Alcohol / Substance Use: Not Applicable Psych Involvement: No (comment)  Admission diagnosis:  Angina at rest [facility]) [I20.8] Chest pain [R07.9] Patient Active Problem List   Diagnosis Date Noted  . Chronic systolic CHF (congestive heart failure) (HCC) 01/26/2020  . Severe aortic stenosis 01/26/2020  . Thrombocytopenia (HCC) 01/26/2020  . CAD (coronary artery disease) 01/25/2020  . PAF (paroxysmal atrial fibrillation) (HCC) 01/25/2020  . Chest pain 01/24/2020  . HLD (hyperlipidemia)   . DM type 2 (diabetes mellitus, type 2) (HCC)   . Alzheimer's type dementia (HCC)   . Late onset Alzheimer's disease without behavioral disturbance (HCC) 01/13/2018  . Senile purpura (HCC) 05/28/2017  . Ear lesion 07/16/2015  . Diabetic lipidosis (HCC) 07/14/2015  . Arteriosclerosis of coronary artery 05/28/2014  . Atrial fibrillation (HCC) 04/28/2014  . Atherosclerosis of native coronary artery of native heart with stable angina pectoris (HCC) 04/28/2014  . Essential hypertension, benign 04/28/2014   PCP:  [name], MD Pharmacy:   [phone] - [place], [place] - [address] [place] [address] Phone: [phone] Fax: [phone]     Social Determinants of Health (SDOH) Interventions    Readmission Risk Interventions No flowsheet data found.

## 2020-01-28 NOTE — Consult Note (Addendum)
Consultation Note Date: 01/28/2020   Patient Name: Patrick Potts  DOB: 1925/03/27  MRN: [id number]  Age / Sex: 86 y.o., male  PCP: Claretta Fraise, MD Referring Physician: Guilford Shi, MD  Reason for Consultation: Establishing goals of care  HPI/Patient Profile: 86 y.o. male  with past medical history of dementia- ? Alzheimer's, ASCAD, a fib not on anticoag, DM2, admitted on 01/24/2020 with increasing hallucinations and chest pain. Troponins revealed to be trending up. CT head negative. Workup reveals likely NSTEMI as well as dementia induced delirium as well as ECHO findings of EF 20-25% with severe aortic stenosis. Family and patient elected not to proceed with aggressive medical care. Palliative consulted for Patrick Potts.   Clinical Assessment and Goals of Care:  I have reviewed medical records including EPIC notes, labs and imaging, examined the patient and met at bedside with patient's sons- Patrick Potts and Patrick Potts as well as having Patrick Potts by phone- to discuss diagnosis prognosis, [place], EOL wishes, disposition and options.  Evaluated patient- he was resting comfortably. His sons state that he has been sedated since receiving lorazepam last night.   I introduced Palliative Medicine as specialized medical care for people living with serious illness. It focuses on providing relief from the symptoms and stress of a serious illness.   We discussed a brief life review of the patient. He is a Psychologist, sport and exercise. He finds his greatest joy [place]. He has 7 sons and is married to his second spouse- first spouse died several years ago.   As far as functional and nutritional status- prior to admission he was completing ADL's independently, however, Patrick Potts shares that he had started to decrease in his ability to function. She frequently noted him being short of breath, she notes that sometimes she would watch him breathe and worry  it was going to be his last breath. He has also had several falls in the last few weeks.  His confusion and disorientation in the evenings have also started getting worse. In addition to his hallucinations-  On the evening before his admission he awoke around 3am and told Patrick Potts that he was going to get out of everyone's way and kill himself. He even went as far as pulling out his pistol. Patrick Potts and his sons were able to calm him and take his pistol. All weapons have been removed from the house.    We discussed his current illness and what it means in the larger context of his on-going co-morbidities.  Natural disease trajectory and expectations at EOL were discussed. I attempted to elicit values and goals of care important to the patient.   All family members readily agreed that quality of life and symptom management were most important. They note "Patrick Potts" would rather have one more good day than several more bad days in his delirious state. They understand that given the recent change in his cardiac function likely as result of NSTEMI- that Patrick Potts's time of life may be limited. Patrick Potts would prefer to be kept at home, comfortable, and  experience EOL at home with comfort rather than returning to the hospital.  The difference between aggressive medical intervention and comfort care was considered in light of the patient's goals of care. Their primary goals of care are to be able to have Patrick Potts at home, in good mental state, for as long as possible. Given the state of his prolonged QTc we discussed how using antipsychotics does come with risk of sudden cardiac death. All agreed that Patrick Potts's priority would be to manage the delirium aggressively even with risk of cardiac death.   We discussed there may come a time where keeping patient comfortable, without pain, not agitated, and also keeping him awake may be a difficult balance to maintain.   Hospice and Palliative Care services outpatient were explained and offered.  Family wishes for Hospice services in their home- they are familiar with Hospice due to patient's spouses daughter receiving their care.   Questions and concerns were addressed.  The family was encouraged to call with questions or concerns.   Primary Decision Maker NEXT OF KIN- patient's spouse- Patrick Potts    SUMMARY OF RECOMMENDATIONS -Transition to comfort focused care -Increase zyprexa to '5mg'$ - plan to give around 1600 as this is when his sundowning appears to start -Additional zyprexa '5mg'$  po dose available as needed -Decrease lorazepam to .'25mg'$  po daily prn -TOC referral for Hospice services at home -Morphine concentrated liquid- 2.'5mg'$  q1hr prn pain or SOB    Code Status/Advance Care Planning:  DNR  Palliative Prophylaxis:   Delirium Protocol  Prognosis:    < 6 months due to recent NSTEMI, new heart failure- EF 20-25%, severe aortic stenosis, plan for comfort measures only  Discharge Planning: Home with Hospice  Primary Diagnoses: Present on Admission: . Chest pain . HLD (hyperlipidemia) . Alzheimer's type dementia ([place])   I have reviewed the medical record, interviewed the patient and family, and examined the patient. The following aspects are pertinent.  Past Medical History:  Diagnosis Date  . Alzheimer's type dementia ([place])   . ASCVD (arteriosclerotic cardiovascular disease)    multivessel, s/p PCI/BM stent implant, ostial RCA, 04/30/06---s/p PCI/Rotablator-assisted DE stent implant LAD diagonal 06/12/06----known occlusion LCx OM small vessel--Intact overall LVEF 55-60% on cath 05/03/06  . Back pain    remote history  . Chronic atrial fibrillation ([facility])    CHADS2VASC score 4 - refused anticoagulation in the past  . DM type 2 (diabetes mellitus, type 2) ([place])   . Essential hypertension, benign 04/28/2014  . HLD (hyperlipidemia)   . Myalgia    from statins  . Myocardial infarction [facility])    Social History   Socioeconomic History  . Marital status: Married      Spouse name: Not on file  . Number of children: Not on file  . Years of education: Not on file  . Highest education level: Not on file  Occupational History  . Occupation: Retired Psychologist, sport and exercise  Tobacco Use  . Smoking status: Never Smoker  . Smokeless tobacco: Never Used  Substance and Sexual Activity  . Alcohol use: No  . Drug use: No  . Sexual activity: Not on file  Other Topics Concern  . Not on file  Social History Narrative  . Not on file   Social Determinants of Health   Financial Resource Strain:   . Difficulty of Paying Living Expenses:   Food Insecurity:   . Worried About Charity fundraiser in the Last Year:   . [place] in the Last  Year:   Transportation Needs:   . Film/video editor (Medical):   Marland Kitchen Lack of Transportation (Non-Medical):   Physical Activity:   . Days of Exercise per Week:   . Minutes of Exercise per Session:   Stress:   . Feeling of Stress :   Social Connections:   . Frequency of Communication with Friends and Family:   . Frequency of Social Gatherings with Friends and Family:   . Attends Religious Services:   . Active Member of Clubs or Organizations:   . Attends Archivist Meetings:   Marland Kitchen Marital Status:    Family History  Problem Relation Age of Onset  . Arrhythmia Mother   . Breast cancer Mother   . Healthy Father    Scheduled Meds: . aspirin EC  81 mg Oral Daily  . clopidogrel  75 mg Oral Daily  . insulin aspart  0-6 Units Subcutaneous TID WC  . isosorbide mononitrate  30 mg Oral Daily  . metoprolol succinate  100 mg Oral Daily  . OLANZapine zydis  5 mg Oral Daily   Continuous Infusions: . sodium chloride 50 mL/hr at 01/28/20 1116   PRN Meds:.acetaminophen, LORazepam, nitroGLYCERIN, OLANZapine zydis, ondansetron (ZOFRAN) IV Medications Prior to Admission:  Prior to Admission medications   Medication Sig Start Date End Date Taking? Authorizing Provider  amLODipine (NORVASC) 10 MG tablet TAKE 1 TABLET BY MOUTH  DAILY. Patient taking differently: Take 10 mg by mouth daily.  06/23/19  Yes Claretta Fraise, MD  aspirin 81 MG tablet Take 81 mg by mouth daily.   Yes [provider]  blood glucose meter kit and supplies KIT Dispense based on patient and insurance preference. Use up to four times daily as directed. (FOR ICD-9 250.00, 250.01). 04/27/17  Yes Stacks, Cletus Gash, MD  clopidogrel (PLAVIX) 75 MG tablet TAKE 1 TABLET BY MOUTH DAILY. GENERIC EQUIVALENT FOR FOR PLAVIX. Patient taking differently: Take 75 mg by mouth daily.  11/25/19  Yes Claretta Fraise, MD  isosorbide mononitrate (IMDUR) 30 MG 24 hr tablet Take 1 tablet (30 mg total) by mouth daily. 11/12/19  Yes Claretta Fraise, MD  metFORMIN (GLUCOPHAGE-XR) 750 MG 24 hr tablet Take 1 tablet (750 mg total) by mouth 2 (two) times daily. Generic equivalent for Glucophage XR 06/24/19  Yes Stacks, Cletus Gash, MD  metoprolol tartrate (LOPRESSOR) 50 MG tablet Take 1 tablet (50 mg total) by mouth 2 (two) times daily. 06/23/19  Yes Stacks, Cletus Gash, MD  nitroGLYCERIN (NITROSTAT) 0.4 MG SL tablet ONE TAB UNDER TONGUE EVERY 5 MINS--DONT EXCEED 3 TABLETS WITHIN 15 MINUTES-CALL 911 Patient taking differently: Place 0.4 mg under the tongue every 5 (five) minutes as needed for chest pain.  11/12/19  Yes Claretta Fraise, MD  atorvastatin (LIPITOR) 10 MG tablet Take 1 tablet (10 mg total) by mouth daily. For cholesterol Patient not taking: Reported on 01/25/2020 06/24/19   Claretta Fraise, MD  Continuous Blood Gluc Receiver (FREESTYLE LIBRE 14 DAY READER) DEVI 1 each by Does not apply route every 14 (fourteen) days. Dx E11.69 08/14/19   Claretta Fraise, MD  Continuous Blood Gluc Sensor (FREESTYLE LIBRE 14 DAY SENSOR) MISC USE AS DIRECTED; Dx E11.69 11/19/19   Claretta Fraise, MD   No Known Allergies Review of Systems  Unable to perform ROS   Physical Exam Vitals and nursing note reviewed.  Pulmonary:     Effort: Pulmonary effort is normal.  Neurological:     Comments: Sedated- UTA  orientation     Vital Signs: BP 124/88 (  BP Location: Left Arm)   Pulse 78   Temp 97.7 F (36.5 C) (Oral)   Resp 17   Ht '5\' 9"'$  (1.753 m)   Wt 71.9 kg   SpO2 96%   BMI 23.41 kg/m  Pain Scale: PAINAD   Pain Score: 0-No pain   SpO2: SpO2: 96 % O2 Device:SpO2: 96 % O2 Flow Rate: .   IO: Intake/output summary: No intake or output data in the 24 hours ending 01/28/20 1456  LBM:   Baseline Weight: Weight: 70 kg Most recent weight: Weight: 71.9 kg     Palliative Assessment/Data: PPS: 30%     Thank you for this consult. Palliative medicine will continue to follow and assist as needed.   Time In: 1400 Time Out: 1530 Time Total: 90 mins Prolonged services- yes Greater than 50%  of this time was spent counseling and coordinating care related to the above assessment and plan.  Signed by: Mariana Kaufman, AGNP-C Palliative Medicine    Please contact Palliative Medicine Team phone at [phone] for questions and concerns.  For individual provider: See Shea Evans

## 2020-01-28 NOTE — Telephone Encounter (Signed)
Patient would like to look over patients chart from hospital. She is asking for Dr. Darlyn Read to call her and see what he thinks about the treatment they have offered patient. She is asking if you will call her when you get time?

## 2020-01-28 NOTE — Plan of Care (Signed)
  Problem: Nutrition: Goal: Adequate nutrition will be maintained Outcome: Progressing   Problem: Coping: Goal: Level of anxiety will decrease Outcome: Progressing   Problem: Pain Managment: Goal: General experience of comfort will improve Outcome: Progressing   Problem: Safety: Goal: Ability to remain free from injury will improve Outcome: Progressing   Problem: Education: Goal: Knowledge of General Education information will improve Description: Including pain rating scale, medication(s)/side effects and non-pharmacologic comfort measures Outcome: Not Progressing Pt continues to be confused in relation to dementia hx

## 2020-01-29 LAB — BASIC METABOLIC PANEL
Anion gap: 9 (ref 5–15)
BUN: 22 mg/dL (ref 8–23)
CO2: 22 mmol/L (ref 22–32)
Calcium: 9.2 mg/dL (ref 8.9–10.3)
Chloride: 110 mmol/L (ref 98–111)
Creatinine, Ser: 1.19 mg/dL (ref 0.61–1.24)
GFR calc Af Amer: >60 mL/min (ref >60)
GFR calc non Af Amer: 53 mL/min — ABNORMAL LOW (ref >60)
Glucose, Bld: 170 mg/dL — ABNORMAL HIGH (ref 70–99)
Potassium: 4.1 mmol/L (ref 3.5–5.1)
Sodium: 141 mmol/L (ref 135–145)

## 2020-01-29 LAB — GLUCOSE, CAPILLARY
Glucose-Capillary: 124 mg/dL — ABNORMAL HIGH (ref 70–99)
Glucose-Capillary: 148 mg/dL — ABNORMAL HIGH (ref 70–99)
Glucose-Capillary: 151 mg/dL — ABNORMAL HIGH (ref 70–99)

## 2020-01-29 LAB — MAGNESIUM: Magnesium: 1.9 mg/dL (ref 1.7–2.4)

## 2020-01-29 MED ORDER — GLYCOPYRROLATE 1 MG PO TABS
1.0000 mg | ORAL_TABLET | ORAL | Status: DC | PRN
  Filled 2020-01-29: qty 1

## 2020-01-29 MED ORDER — ONDANSETRON HCL 4 MG/2ML IJ SOLN: 4.0000 mg | Freq: Four times a day (QID) | INTRAMUSCULAR | Status: DC | PRN

## 2020-01-29 MED ORDER — HALOPERIDOL LACTATE 5 MG/ML IJ SOLN
0.5000 mg | Freq: Once | INTRAMUSCULAR | Status: AC
  Administered 2020-01-29: 0.5 mg via INTRAVENOUS
  Filled 2020-01-29: qty 1

## 2020-01-29 MED ORDER — ONDANSETRON 4 MG PO TBDP
4.0000 mg | ORAL_TABLET | Freq: Four times a day (QID) | ORAL | Status: DC | PRN
  Filled 2020-01-29: qty 1

## 2020-01-29 MED ORDER — SODIUM CHLORIDE 0.9% FLUSH: 3.0000 mL | Freq: Two times a day (BID) | INTRAVENOUS | Status: DC

## 2020-01-29 MED ORDER — BIOTENE DRY MOUTH MT LIQD: 15.0000 mL | OROMUCOSAL | Status: DC | PRN

## 2020-01-29 MED ORDER — GLYCOPYRROLATE 0.2 MG/ML IJ SOLN: 0.2000 mg | INTRAMUSCULAR | Status: DC | PRN

## 2020-01-29 MED ORDER — OLANZAPINE 5 MG PO TBDP
5.0000 mg | ORAL_TABLET | Freq: Four times a day (QID) | ORAL | Status: DC | PRN
  Administered 2020-01-29: 5 mg via ORAL
  Filled 2020-01-29 (×2): qty 1

## 2020-01-29 MED ORDER — SODIUM CHLORIDE 0.9 % IV SOLN: 250.0000 mL | INTRAVENOUS | Status: DC | PRN

## 2020-01-29 MED ORDER — DIPHENHYDRAMINE HCL 50 MG/ML IJ SOLN
12.5000 mg | Freq: Once | INTRAMUSCULAR | Status: AC
  Administered 2020-01-29: 12.5 mg via INTRAVENOUS
  Filled 2020-01-29: qty 1

## 2020-01-29 MED ORDER — ACETAMINOPHEN 650 MG RE SUPP: 650.0000 mg | Freq: Four times a day (QID) | RECTAL | Status: DC | PRN

## 2020-01-29 MED ORDER — ACETAMINOPHEN 325 MG PO TABS: 650.0000 mg | ORAL_TABLET | Freq: Four times a day (QID) | ORAL | Status: DC | PRN

## 2020-01-29 MED ORDER — POLYVINYL ALCOHOL 1.4 % OP SOLN
1.0000 [drp] | Freq: Four times a day (QID) | OPHTHALMIC | Status: DC | PRN
  Filled 2020-01-29: qty 15

## 2020-01-29 MED ORDER — SODIUM CHLORIDE 0.9% FLUSH: 3.0000 mL | INTRAVENOUS | Status: DC | PRN
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
## 2020-01-29 NOTE — TOC Progression Note (Signed)
Transition of Care [facility]) - Progression Note    Patient Details  Name: Patrick Potts MRN: [id number] Date of Birth: 03-13-1925  [facility]) CM/SW Contact  Graves-Bigelow, Lamar Laundry, RN Phone Number: 01/29/2020, 10:25 AM  Clinical Narrative: Case Manager spoke with [facility] will be serviced via Hospice. Hospice to visit the patient once he gets home. Case Manager to continue to follow for additional transition of care needs.     Expected Discharge Plan: Home w Hospice Care Barriers to Discharge: No Barriers Identified  Expected Discharge Plan and Services Expected Discharge Plan: Home w Hospice Care In-house Referral: NA Discharge Planning Services: CM Consult Post Acute Care Choice: Hospice Living arrangements for the past 2 months: Single Family Home   HH Arranged: RN [facility] Agency: [facility] [place] Date [facility] Agency Contacted: 01/28/20 Time HH Agency Contacted: 1525 Representative spoke with at [facility] Agency: Cassandra- voicemail left   Readmission Risk Interventions No flowsheet data found.

## 2020-01-29 NOTE — Progress Notes (Signed)
Daily Progress Note   Patient Name: Patrick Potts       Date: 01/29/2020 DOB: 1925/04/14  Age: 86 y.o. MRN#: [id number] Attending Physician: Alessandra Bevels, MD Primary Care Physician: [name], MD Admit Date: 01/24/2020  Reason for Consultation/Follow-up: Establishing goals of care  Subjective: Patient in bed, picking at the sky, his speech is unintelligible. His breathing is labored at times. He will not keep oxygen on. Per nursing he had an episode of agitation today around lunch time.  He has not been eating or drinking since admission.  I called his wife- Patrick Potts.  We discussed his overall trajectory and planning. I expressed concerns that Patrick Potts is closer to end of life then we may have realized. She tells me that she agrees and felt that is what is happening.  She states that Patrick Potts would prefer to proceed through dying process with "what dignity he has left" rather than continue attempts to prolong his life.  Patrick Potts expresses that while she would like to bring Patrick Potts home- she is 37 and cannot care for him.  Given his decompensation during admission- not eating or drinking- and mental status not improving- we discussed transition to full comfort measures- focusing care on symptom management only and requesting bed at residential Hospice.  Patrick Potts is in agreement and somewhat relieved with this plan.   ROS  Length of Stay: 3  Current Medications: Scheduled Meds:  . aspirin EC  81 mg Oral Daily  . clopidogrel  75 mg Oral Daily  . diphenhydrAMINE  12.5 mg Intravenous Once  . insulin aspart  0-6 Units Subcutaneous TID WC  . isosorbide mononitrate  30 mg Oral Daily  . metoprolol succinate  100 mg Oral Daily  . OLANZapine zydis  5 mg Oral Daily    Continuous Infusions:   PRN  Meds: acetaminophen, morphine CONCENTRATE, nitroGLYCERIN, OLANZapine zydis, ondansetron (ZOFRAN) IV  Physical Exam Vitals and nursing note reviewed.  Constitutional:      Comments: Drowsy- falls asleep mid sentence  Pulmonary:     Comments: Increased effort at rest Neurological:     Mental Status: He is disoriented.     Comments: Unintelligible speech             Vital Signs: BP 128/87   Pulse 86   Temp 97.9 F (36.6 C) (  Oral)   Resp 18   Ht 5\' 9"  (1.753 m)   Wt 71.9 kg   SpO2 98%   BMI 23.41 kg/m  SpO2: SpO2: 98 % O2 Device: O2 Device: Nasal Cannula O2 Flow Rate: O2 Flow Rate (L/min): 2 L/min  Intake/output summary:   Intake/Output Summary (Last 24 hours) at 01/29/2020 1719 Last data filed at 01/28/2020 2249 Gross per 24 hour  Intake --  Output 350 ml  Net -350 ml   LBM:   Baseline Weight: Weight: 70 kg Most recent weight: Weight: 71.9 kg       Palliative Assessment/Data: PPS: 20%      Patient Active Problem List   Diagnosis Date Noted  . Goals of care, counseling/discussion   . Advanced care planning/counseling discussion   . Palliative care by specialist   . Chronic systolic CHF (congestive heart failure) ([place]) 01/26/2020  . Severe aortic stenosis 01/26/2020  . Thrombocytopenia ([place]) 01/26/2020  . CAD (coronary artery disease) 01/25/2020  . PAF (paroxysmal atrial fibrillation) ([place]) 01/25/2020  . Chest pain 01/24/2020  . HLD (hyperlipidemia)   . DM type 2 (diabetes mellitus, type 2) ([place])   . Alzheimer's type dementia ([place])   . Late onset Alzheimer's disease without behavioral disturbance ([place]) 01/13/2018  . Senile purpura ([place]) 05/28/2017  . Ear lesion 07/16/2015  . Diabetic lipidosis ([place]) 07/14/2015  . Arteriosclerosis of coronary artery 05/28/2014  . Atrial fibrillation ([place]) 04/28/2014  . Atherosclerosis of native coronary artery of native heart with stable angina pectoris ([place]) 04/28/2014  . Essential hypertension, benign 04/28/2014     Palliative Care Assessment & Plan   Patient Profile: 86 y.o. male  with past medical history of dementia- ? Alzheimer's, ASCAD, a fib not on anticoag, DM2, admitted on 01/24/2020 with increasing hallucinations and chest pain. Troponins revealed to be trending up. CT head negative. Workup reveals likely NSTEMI as well as dementia induced delirium as well as ECHO findings of EF 20-25% with severe aortic stenosis. Family and patient elected not to proceed with aggressive medical care. Palliative consulted for [place].   Assessment/Recommendations/Plan   Goals of care- full comfort care  D/C CBG's or other labs, no IV fluids  Morphine may be helpful for agitation that is refractory to antipsychotics- discussed with RN  Other comfort orders as entered  Goals of Care and Additional Recommendations:  Limitations on Scope of Treatment: Full Comfort Care  Code Status:  DNR  Prognosis:   < 2 weeks due to heart failure, recent infarct, no plans for further medical workup, delirium, not eating or drinking  Discharge Planning:  Hospice facility  Care plan was discussed with patient's spouse- Patrick Potts.   Thank you for allowing the Palliative Medicine Team to assist in the care of this patient.   Time In: 1630 Time Out: 1730 Total Time 60 mins Prolonged Time Billed yes      Greater than 50%  of this time was spent counseling and coordinating care related to the above assessment and plan.  Mariana Kaufman, AGNP-C Palliative Medicine   Please contact Palliative Medicine Team phone at [phone] for questions and concerns.

## 2020-01-29 NOTE — Plan of Care (Signed)
  Problem: Nutrition: Goal: Adequate nutrition will be maintained Outcome: Progressing   Problem: Coping: Goal: Level of anxiety will decrease Outcome: Progressing   Problem: Pain Managment: Goal: General experience of comfort will improve Outcome: Progressing   Problem: Safety: Goal: Ability to remain free from injury will improve Outcome: Progressing   Problem: Education: Goal: Knowledge of General Education information will improve Description: Including pain rating scale, medication(s)/side effects and non-pharmacologic comfort measures Outcome: Not Progressing Pt continues to be disoriented

## 2020-01-29 NOTE — Progress Notes (Addendum)
PROGRESS NOTE    Patrick Potts  AYO:[id number]  DOB: [birth year]  PCP: Claretta Fraise, MD Admit date:01/24/2020  86 y.o.malewith a hx of HTN, CAD, S/P PCI to RCA/LAD and known occluded LCx,  hx of PAF on chronic anticoagulation, mild Alzheimer's dementia brought in by family due to c/o exertional chest pain and visual hallucinations-He thought he saw someone sitting on achair who actually was not there. He was alert, oriented and communicative when seen in ED, apparently ambulates without assistive device and still drives.   ED Course: Afebrile, stable vitals.Hstroponin149 > 141,EKG showing atrial fibrillation, unchanged from prior. QTC 561. Unremarkable CBC, BMP. Portable chest x-ray cardiomegaly with mild vascular congestion. Head CT unremarkable. EDPtalked to cardiology on-call Dr. Radford Pax, recommended admission to [facility], start heparin drip Hospital course: Patient admitted to [facility] for further evaluation and management with cardiology consultation. Work up revealed new systolic cardiomyopathy with Severely reduced EF of 20% and probable severe AS. Dementia and his current care goals limit interventional options including TAVR. Medical management recommended with ASA, plavix, BB, nitrates (without dose increase due to AS) and statins. Infectious w/u for behavioral disturbances/hallucinations unremarkable and symptomatic management for dementia related behavioral changes being contemplated including Seroquel but concern for prolonged Qtc and cardiac risks. Palliative care consulted for further assistance in symptom management and care goal delineation. Patient has 7 sons and lives with his 2nd wife.  Family leaning towards nonaggressive care but also concerned about symptom management at home as apparently threatened to harm wife recently. 3/23: Patient had worsening delirium/hallucinations-did not respond to Zyprexa and received 2.5 mg IV Ativan around 9:45 PM.   3/24: drowsy and more  delirious this morning.  Sons Mark and David at bed side.  Palliative care increased zyprexa dose. Psychiatry consulted  Subjective:  Patient appears more awake now but still confused, hallucinating--picking on sheets -thinks there is a puppy on the bed. Seen by psychiatry APP  Objective: Vitals:   01/28/20 2105 01/28/20 2107 01/29/20 0359 01/29/20 0800  BP: 129/83  (!) 128/95 128/87  Pulse:   85 86  Resp: 18  (!) 26 18  Temp:  97.6 F (36.4 C) 97.9 F (36.6 C)   TempSrc:  Oral Oral   SpO2: 100%  98%   Weight:      Height:        Intake/Output Summary (Last 24 hours) at 01/29/2020 1240 Last data filed at 01/28/2020 2249 Gross per 24 hour  Intake 140.3 ml  Output 350 ml  Net -209.7 ml   Filed Weights   01/26/20 0244 01/27/20 0523 01/28/20 0231  Weight: 71.5 kg 72.4 kg 71.9 kg    Physical Examination:  General exam: Appears calm and comfortable  Respiratory system: Clear to auscultation. Respiratory effort normal. Cardiovascular system: S1 & S2 heard, RRR. No JVD, + systolic ejection murmur, No pedal edema. Gastrointestinal system: Abdomen is nondistended, soft and nontender. Normal bowel sounds heard. Central nervous system: Somnolent, drowsy.  Easily arousable and was able to name one of his sons.  Intermittently picking in the air.   Extremities: No contractures, edema or joint deformities.  Skin: No rashes, lesions or ulcers Psychiatry: Judgement and insight impaired today Data Reviewed: I have personally reviewed following labs and imaging studies  CBC: Recent Labs  Lab 01/24/20 1535 01/26/20 0437 01/27/20 0330 01/28/20 0354  WBC 8.3 5.5 6.6 6.0  NEUTROABS 6.5  --   --   --   HGB 13.2 11.4* 11.9* 11.7*  HCT 41.0 35.9*  36.3* 35.3*  MCV 97.9 99.7 96.0 95.1  PLT 149* 132* 140* 998*   Basic Metabolic Panel: Recent Labs  Lab 01/24/20 1535 01/26/20 0437 01/28/20 1026 01/29/20 0855  NA 141 141 141 141  K 4.1 3.8 3.7 4.1  CL 107 107 106 110  CO2 21* '23  23 22  '$ GLUCOSE 201* 150* 136* 170*  BUN 39* 36* 25* 22  CREATININE 1.24 1.34* 1.30* 1.19  CALCIUM 9.8 9.3 9.0 9.2  MG 1.6*  --  1.8  --    GFR: Estimated Creatinine Clearance: 39.6 mL/min (by C-G formula based on SCr of 1.19 mg/dL). Liver Function Tests: Recent Labs  Lab 01/24/20 1535  AST 42*  ALT 32  ALKPHOS 72  BILITOT 2.7*  PROT 7.2  ALBUMIN 4.3   No results for input(s): LIPASE, AMYLASE in the last 168 hours. No results for input(s): AMMONIA in the last 168 hours. Coagulation Profile: No results for input(s): INR, PROTIME in the last 168 hours. Cardiac Enzymes: No results for input(s): CKTOTAL, CKMB, CKMBINDEX, TROPONINI in the last 168 hours. BNP (last 3 results) No results for input(s): PROBNP in the last 8760 hours. HbA1C: No results for input(s): HGBA1C in the last 72 hours. CBG: Recent Labs  Lab 01/28/20 1141 01/28/20 1700 01/28/20 2059 01/29/20 0744 01/29/20 1123  GLUCAP 113* 115* 171* 124* 148*   Lipid Profile: No results for input(s): CHOL, HDL, LDLCALC, TRIG, CHOLHDL, LDLDIRECT in the last 72 hours. Thyroid Function Tests: No results for input(s): TSH, T4TOTAL, FREET4, T3FREE, THYROIDAB in the last 72 hours. Anemia Panel: No results for input(s): VITAMINB12, FOLATE, FERRITIN, TIBC, IRON, RETICCTPCT in the last 72 hours. Sepsis Labs: No results for input(s): PROCALCITON, LATICACIDVEN in the last 168 hours.  Recent Results (from the past 240 hour(s))  SARS CORONAVIRUS 2 (TAT 6-24 HRS) Nasopharyngeal Nasopharyngeal Swab     Status: None   Collection Time: 01/24/20  7:41 PM   Specimen: Nasopharyngeal Swab  Result Value Ref Range Status   SARS Coronavirus 2 NEGATIVE NEGATIVE Final    Comment: (NOTE) SARS-CoV-2 target nucleic acids are NOT DETECTED. The SARS-CoV-2 RNA is generally detectable in upper and lower respiratory specimens during the acute phase of infection. Negative results do not preclude SARS-CoV-2 infection, do not rule  out co-infections with other pathogens, and should not be used as the sole basis for treatment or other patient management decisions. Negative results must be combined with clinical observations, patient history, and epidemiological information. The expected result is Negative. Fact Sheet for Patients: SugarRoll.be Fact Sheet for Healthcare Providers: https://www.woods-mathews.com/ This test is not yet approved or cleared by the [place] FDA and  has been authorized for detection and/or diagnosis of SARS-CoV-2 by FDA under an Emergency Use Authorization (EUA). This EUA will remain  in effect (meaning this test can be used) for the duration of the COVID-19 declaration under Section 56 4(b)(1) of the Act, 21 U.S.C. section 360bbb-3(b)(1), unless the authorization is terminated or revoked sooner. Performed at [facility], [place] [address], [place], [place] [address]       Radiology Studies: No results found.      Scheduled Meds: . aspirin EC  81 mg Oral Daily  . clopidogrel  75 mg Oral Daily  . haloperidol lactate  0.5 mg Intravenous Once  . insulin aspart  0-6 Units Subcutaneous TID WC  . isosorbide mononitrate  30 mg Oral Daily  . metoprolol succinate  100 mg Oral Daily  . OLANZapine zydis  5 mg Oral Daily   Continuous Infusions: . sodium chloride Stopped (01/29/20 0349)    Assessment/Plan:  1. Dementia now with hallucinations/delirium: mostly at night/early morning suggestive of sundowning. CT head on admission showed atrophy and advanced chronic microvascular ischemic changes. Been reluctant to prescribe high dose antipsychotics (seroquel/haldol/risperdal) given prolonged qtc. Now on Zyprexa '5mg'$  prn --probably safest option for hallucinations as d/w psychiatry (they also suggest adding remeron at night for sleep). Worsened condition with Ativan the other night- would avoid all benzodiazepines for now as d/w family.   Continue to utilize zyprexa prn for agitation-palliative care team has increased the dose. Will give one dose of Haldol 0.'5mg'$  now for acute delirium/agitation. Family understands risks.  Per family, patient has had episodes of agitation at home,had pulled out a gun recently and threatened to harm wife/himself.  Will need as needed medications for family to administer in case of agitation at home.  Advised family to remove all harmful equipment like guns from home. Appreciate palliative care/ psychiatry assistance. Encourage oral intake, d/c IVF in concern for low EF and improved creatinine.   2. Exertional chest pain:  Patient does have h/o CAD and underwent PTCA x 2. Seen by cardiology and underwent work-up.  Ruled out for MI. Echo showed severely reduced EF of 20% and probable severe AS. continue ASA, plavix, metoprolol, Imdur. D/C heparin drip. Off statins due to intolerance a year ago. Likely aortic stenosis playing a part in exertional symptoms. Cardiology advised not to increase long acting nitrates. Family does not want to pursue aggressive interventions and medical management only at this time.   3. New systolic cardiomyopathy with low EF 25%: Euvolemic currently. Soft Bps. Not on lasix. Not on ACEI due to AKI. Noted that Lopressor '50mg'$  bid changed to 100 mg XL today. To f/u Dr Percival Spanish on discharge per Dr Margaretann Loveless.  4. AKI on CKD stage 1: baseline creat appear to be around 1.0 with GFR 60. Currently BUN upto 38 and creat at 1.34. Not on diuretics. Low output with EF 25% .  Oral intake has been poor in the last 2 days with confusion. Improved with ginger hydration.  Hold off on ACEI.   5. HTN: Continue metoprolol, Imdur. Norvasc discontinued by cardiology  6. Prolonged Qtc: Keep Mg close to 2 and potassium close to 4 . Received IV mag x 1 on 3/23 and level improved to 1.8. Received 2nd  dosage 3/24.  Repeat EKG shows improvement in QTC to 526 ms.  Continue beta-blockers.  7.Chronic atrial  fibrillation-CHADS2VASC score 4 but currently not on anticoagulation - patient has declined in the past. continue metoprolol '50mg'$  BID for rate control.  Now off heparin drip  8. Goals of care: I had extensive discussion with sons Glyn Ade and [name].  They met with palliative care team and leaning towards home hospice.  They however would like outpatient plan for symptom management. If cannot manage (wife  Is 12 y/o) , may need inpatient hospice.  Appreciate palliative care assistance  DVT prophylaxis: D/C heparin drip and SCD/ted hose Code Status: DNR Family / Patient Communication: D/W son at bedside earlier. Called and updated wife Carylon Perches [phone] Disposition Plan:   Patient is from home prior to hospitalization. Received/Receiving inpatient care for chest pain/dementia/delirium Discharge in a.m. with home palliative care/hospice if symptoms of agitation/delirium improved and manageable at home by family.  DCed cardiac monitor for comfort goals     LOS: 3 days    Time spent: 35 minutes  Guilford Shi, MD [facility] Pager in [place]  If 7PM-7AM, please contact night-coverage [email or web address] 01/29/2020, 12:40 PM

## 2020-01-30 MED ORDER — GLYCOPYRROLATE 1 MG PO TABS: 1.0000 mg | ORAL_TABLET | ORAL | Status: AC | PRN

## 2020-01-30 MED ORDER — ONDANSETRON 4 MG PO TBDP: 4.0000 mg | ORAL_TABLET | Freq: Four times a day (QID) | ORAL | 0 refills | Status: AC | PRN

## 2020-01-30 MED ORDER — POLYVINYL ALCOHOL 1.4 % OP SOLN: 1.0000 [drp] | Freq: Four times a day (QID) | OPHTHALMIC | 0 refills | Status: AC | PRN

## 2020-01-30 MED ORDER — OLANZAPINE 5 MG PO TBDP: 5.0000 mg | ORAL_TABLET | Freq: Four times a day (QID) | ORAL | Status: AC | PRN

## 2020-01-30 MED ORDER — MORPHINE SULFATE (CONCENTRATE) 10 MG/0.5ML PO SOLN: 2.5000 mg | ORAL | Status: AC | PRN

## 2020-01-30 MED ORDER — METOPROLOL SUCCINATE ER 100 MG PO TB24: 100.0000 mg | ORAL_TABLET | Freq: Every day | ORAL | Status: AC

## 2020-01-30 MED ORDER — ACETAMINOPHEN 650 MG RE SUPP: 650.0000 mg | Freq: Four times a day (QID) | RECTAL | 0 refills | Status: AC | PRN

## 2020-01-30 MED ORDER — BIOTENE DRY MOUTH MT LIQD: 15.0000 mL | OROMUCOSAL | Status: AC | PRN

## 2020-01-30 MED ORDER — ALBUTEROL SULFATE (2.5 MG/3ML) 0.083% IN NEBU: 2.5000 mg | INHALATION_SOLUTION | Freq: Four times a day (QID) | RESPIRATORY_TRACT | 12 refills | Status: AC | PRN

## 2020-01-30 MED ORDER — OLANZAPINE 5 MG PO TBDP: 5.0000 mg | ORAL_TABLET | Freq: Every day | ORAL | Status: AC

## 2020-01-30 NOTE — Discharge Summary (Signed)
Physician Discharge Summary  Patrick Potts NID:[id number] DOB: 13-Jun-1925 DOA: 01/24/2020  PCP: Claretta Fraise, MD  Admit date: 01/24/2020 Discharge date: 01/30/2020 Consultations: Palliative care, Psychaitry Admitted From: home Disposition: [facility]  Discharge Diagnoses:  Principal Problem:   Chest pain Active Problems:   HLD (hyperlipidemia)   DM type 2 (diabetes mellitus, type 2) ([place])   Alzheimer's type dementia ([place])   CAD (coronary artery disease)   PAF (paroxysmal atrial fibrillation) (HCC)   Chronic systolic CHF (congestive heart failure) (HCC)   Severe aortic stenosis   Thrombocytopenia ([place])   Goals of care, counseling/discussion   Advanced care planning/counseling discussion   Palliative care by specialist   Hospital Course Summary: 86 y.o.malewith a hx ofHTN,CAD,S/P PCIto RCA/LADand known occluded LCx,hx of PAF on chronic anticoagulation, mildAlzheimer's dementiabrought in by family due to c/o exertional chest pain and visual hallucinations-He thought he saw someone sitting on achair who actually was not there.He was alert, oriented and communicative when seen in ED, apparentlyambulates without assistive deviceandstill drives. ED Course: Afebrile, stable vitals.Hstroponin149 > 141,EKG showing atrial fibrillation, unchanged from prior. QTC 561. Unremarkable CBC, BMP. Portable chest x-ray cardiomegaly with mild vascular congestion. Head CT unremarkable. EDPtalked to cardiology on-call Dr. Radford Pax, recommended admission to [facility], to start heparin drip Hospital course: Patient admitted to [facility] forfurther evaluation and management with cardiology consultation. Work up revealed new systolic cardiomyopathy withSeverely reduced EF of 20% and probable severe AS. Dementiaand his current care goalslimit interventional options including TAVR. Medical management recommended withASA, plavix, BB,nitrates (without dose increase due to AS)  andstatins.Infectious w/u for behavioral disturbances/hallucinations unremarkable and symptomatic management for dementia related behavioral changes being contemplated including Seroquel but concern for prolonged Qtc and cardiac risks. Palliative care consulted for further assistance in symptom management and care goal delineation. Patient has 7 sons and lives with his 2nd wife.  Family was leaning towards nonaggressive care but also were concerned about symptom management at home as apparently threatened to harm wife recently. 3/23: Patient had worsening delirium/hallucinations-did not respond to Zyprexa and received 2.5 mg IV Ativan around 9:45 PM.   3/24: drowsy and more delirious this morning.  Sons Mark and David at bed side.  Palliative care increased zyprexa dose. Psychiatry consulted 3/25: Seen by psychiatry and agreed with zyprexa scheduled/PRN, recommended adding remeron. Patient however did not respond much and continued to have hallucinations. Seen by pallitaive care for follow up and transitioned to full comfort care later in the evening after discussing with wife and sons. Residential hospice pursued. 3/26: Has a bed at [facility], discharge orders placed. DNR.  1. Dementia now with hallucinations/delirium: mostly at night/early morning suggestive of sundowning. CT head on admission showed atrophy and advanced chronic microvascular ischemic changes. Been reluctant to prescribe high dose antipsychotics (seroquel/haldol/risperdal) given prolonged qtc. Now on Zyprexa '5mg'$  prn --probably safest option for hallucinations as d/w psychiatry (they also suggest adding remeron at night for sleep). Worsened condition with Ativan the other night- would avoid all benzodiazepines for now as d/w family.  Continue to utilize zyprexa prn for agitation-palliative care team has increased the dose. Will give one dose of Haldol 0.'5mg'$  now for acute delirium/agitation. Family understands risks.  Per family,  patient has had episodes of agitation at home,had pulled out a gun recently and threatened to harm wife/himself.  Will need as needed medications for family to administer in case of agitation at home.  Advised family to remove all harmful equipment like guns from home. Appreciate palliative care/  psychiatry assistance. Encourage oral intake, d/c IVF in concern for low EF and improved creatinine.   2. Exertional chest pain:  Patient does have h/o CAD and underwent PTCA x 2. Seen by cardiology and underwent work-up.  Ruled out for MI. Echo showed severely reduced EF of 20% and probable severe AS. Cardiology recommended medical management with  ASA, plavix, metoprolol, Imdur. Discontinued heparin drip. Off statins due to intolerance a year ago. Likely aortic stenosis playing a part in exertional symptoms. Cardiology advised not to increase long acting nitrates. --Now comfort care only, nitro prn   3. New systolic cardiomyopathy with low EF 25%: Euvolemic currently. Soft Bps. Not on lasix. Not on ACEI due to AKI. Been on b-blockers. Plan as to f/u Dr Percival Spanish on discharge per Dr Wilford Grist comfort care only.  4. AKI on CKD stage 1: baseline creat appear to be around 1.0 with GFR 60.  BUN upto 38 and creat at 1.34 while here. Not on diuretics. Low output with EF 25% .  Oral intake has been poor in the last 2 days with confusion. Improved with ginger hydration.  Hold off on ACEI. --now comfort goals  5. Prolonged Qtc: limited use of antipsychotics during hospital stay. Received IV mag x 1 on 3/23 and level improved to 1.8. Received 2nd  dosage 3/24.  Repeat EKG showed improvement in QTC to 526 ms. On beta-blockers--antipsychotics for comfort now.  7.Chronic atrial fibrillation-CHADS2VASC score 4 but currently not on anticoagulation - patient has declined in the past. On metoprolol '50mg'$  BID for rate control.  Now off heparin drip  8. Goals of care: I had extensive discussion with wife [name] and sons  Glyn Ade and [name].  They met with palliative care team and decided on  hospice.  Appreciate palliative care assistance   Discharge Exam:  Vitals:   01/29/20 0359 01/29/20 0800  BP: (!) 128/95 128/87  Pulse: 85 86  Resp: (!) 26 18  Temp: 97.9 F (36.6 C)   SpO2: 98%    Vitals:   01/28/20 2105 01/28/20 2107 01/29/20 0359 01/29/20 0800  BP: 129/83  (!) 128/95 128/87  Pulse:   85 86  Resp: 18  (!) 26 18  Temp:  97.6 F (36.4 C) 97.9 F (36.6 C)   TempSrc:  Oral Oral   SpO2: 100%  98%   Weight:      Height:        General: Pt is sedated Cardiovascular: RRR, W9/N9 with systolic murmur Respiratory: occassional rhonchi Abdominal: Soft, NT, ND, bowel sounds + Extremities: no edema, no cyanosis  Discharge Condition: terminal CODE STATUS: DNR Diet recommendation: comfort feeds Recommendations for Outpatient Follow-up:  Hospice MD at the facility  Discharge Instructions:  Discharge Instructions    Call MD for:   Complete by: As directed    Comfort measures as needed   Diet - low sodium heart healthy   Complete by: As directed    Increase activity slowly   Complete by: As directed      Allergies as of 01/30/2020   No Known Allergies     Medication List    STOP taking these medications   amLODipine 10 MG tablet Commonly known as: NORVASC   aspirin 81 MG tablet   atorvastatin 10 MG tablet Commonly known as: LIPITOR   blood glucose meter kit and supplies Kit   clopidogrel 75 MG tablet Commonly known as: PLAVIX   FreeStyle Libre 14 Day Reader [employer] 14 Day  Sensor Misc   isosorbide mononitrate 30 MG 24 hr tablet Commonly known as: IMDUR   metFORMIN 750 MG 24 hr tablet Commonly known as: GLUCOPHAGE-XR   metoprolol tartrate 50 MG tablet Commonly known as: LOPRESSOR     TAKE these medications   acetaminophen 650 MG suppository Commonly known as: TYLENOL Place 1 suppository (650 mg total) rectally every 6 (six) hours as needed for  mild pain (or Fever >/= 101).   albuterol (2.5 MG/3ML) 0.083% nebulizer solution Commonly known as: PROVENTIL Take 3 mLs (2.5 mg total) by nebulization every 6 (six) hours as needed for wheezing or shortness of breath.   antiseptic oral rinse Liqd Apply 15 mLs topically as needed for dry mouth.   glycopyrrolate 1 MG tablet Commonly known as: ROBINUL Take 1 tablet (1 mg total) by mouth every 4 (four) hours as needed (excessive secretions).   metoprolol succinate 100 MG 24 hr tablet Commonly known as: TOPROL-XL Take 1 tablet (100 mg total) by mouth daily. Take with or immediately following a meal.   morphine CONCENTRATE 10 MG/0.5ML Soln concentrated solution Take 0.13 mLs (2.6 mg total) by mouth every hour as needed for moderate pain or shortness of breath (agitation).   nitroGLYCERIN 0.4 MG SL tablet Commonly known as: NITROSTAT ONE TAB UNDER TONGUE EVERY 5 MINS--DONT EXCEED 3 TABLETS WITHIN 15 MINUTES-CALL 911 What changed:   how much to take  how to take this  when to take this  reasons to take this  additional instructions   OLANZapine zydis 5 MG disintegrating tablet Commonly known as: ZYPREXA Take 1 tablet (5 mg total) by mouth daily.   OLANZapine zydis 5 MG disintegrating tablet Commonly known as: ZYPREXA Take 1 tablet (5 mg total) by mouth every 6 (six) hours as needed (severe agitation).   ondansetron 4 MG disintegrating tablet Commonly known as: ZOFRAN-ODT Take 1 tablet (4 mg total) by mouth every 6 (six) hours as needed for nausea.   polyvinyl alcohol 1.4 % ophthalmic solution Commonly known as: LIQUIFILM TEARS Place 1 drop into both eyes 4 (four) times daily as needed for dry eyes.      [place], [facility] Follow up.   Why: Registered Nurse- office to call with a visit time.  Contact information: [address] [place] [address] [phone]          No Known Allergies    The results of significant  diagnostics from this hospitalization (including imaging, microbiology, ancillary and laboratory) are listed below for reference.    Labs: BNP (last 3 results) No results for input(s): BNP in the last 8760 hours. Basic Metabolic Panel: Recent Labs  Lab 01/24/20 1535 01/26/20 0437 01/28/20 1026 01/29/20 0855  NA 141 141 141 141  K 4.1 3.8 3.7 4.1  CL 107 107 106 110  CO2 21* '23 23 22  '$ GLUCOSE 201* 150* 136* 170*  BUN 39* 36* 25* 22  CREATININE 1.24 1.34* 1.30* 1.19  CALCIUM 9.8 9.3 9.0 9.2  MG 1.6*  --  1.8 1.9   Liver Function Tests: Recent Labs  Lab 01/24/20 1535  AST 42*  ALT 32  ALKPHOS 72  BILITOT 2.7*  PROT 7.2  ALBUMIN 4.3   No results for input(s): LIPASE, AMYLASE in the last 168 hours. No results for input(s): AMMONIA in the last 168 hours. CBC: Recent Labs  Lab 01/24/20 1535 01/26/20 0437 01/27/20 0330 01/28/20 0354  WBC 8.3 5.5 6.6 6.0  NEUTROABS 6.5  --   --   --  HGB 13.2 11.4* 11.9* 11.7*  HCT 41.0 35.9* 36.3* 35.3*  MCV 97.9 99.7 96.0 95.1  PLT 149* 132* 140* 139*   Cardiac Enzymes: No results for input(s): CKTOTAL, CKMB, CKMBINDEX, TROPONINI in the last 168 hours. BNP: Invalid input(s): POCBNP CBG: Recent Labs  Lab 01/28/20 1700 01/28/20 2059 01/29/20 0744 01/29/20 1123 01/29/20 1643  GLUCAP 115* 171* 124* 148* 151*   D-Dimer No results for input(s): DDIMER in the last 72 hours. Hgb A1c No results for input(s): HGBA1C in the last 72 hours. Lipid Profile No results for input(s): CHOL, HDL, LDLCALC, TRIG, CHOLHDL, LDLDIRECT in the last 72 hours. Thyroid function studies No results for input(s): TSH, T4TOTAL, T3FREE, THYROIDAB in the last 72 hours.  Invalid input(s): FREET3 Anemia work up No results for input(s): VITAMINB12, FOLATE, FERRITIN, TIBC, IRON, RETICCTPCT in the last 72 hours. Urinalysis    Component Value Date/Time   COLORURINE AMBER (A) 01/24/2020 1454   APPEARANCEUR HAZY (A) 01/24/2020 1454   APPEARANCEUR Clear  03/19/2017 1513   LABSPEC 1.027 01/24/2020 1454   PHURINE 5.0 01/24/2020 1454   GLUCOSEU NEGATIVE 01/24/2020 1454   HGBUR NEGATIVE 01/24/2020 1454   BILIRUBINUR NEGATIVE 01/24/2020 1454   BILIRUBINUR Negative 03/19/2017 1513   KETONESUR NEGATIVE 01/24/2020 1454   PROTEINUR 100 (A) 01/24/2020 1454   NITRITE NEGATIVE 01/24/2020 1454   LEUKOCYTESUR NEGATIVE 01/24/2020 1454   Sepsis Labs Invalid input(s): PROCALCITONIN,  WBC,  LACTICIDVEN Microbiology Recent Results (from the past 240 hour(s))  SARS CORONAVIRUS 2 (TAT 6-24 HRS) Nasopharyngeal Nasopharyngeal Swab     Status: None   Collection Time: 01/24/20  7:41 PM   Specimen: Nasopharyngeal Swab  Result Value Ref Range Status   SARS Coronavirus 2 NEGATIVE NEGATIVE Final    Comment: (NOTE) SARS-CoV-2 target nucleic acids are NOT DETECTED. The SARS-CoV-2 RNA is generally detectable in upper and lower respiratory specimens during the acute phase of infection. Negative results do not preclude SARS-CoV-2 infection, do not rule out co-infections with other pathogens, and should not be used as the sole basis for treatment or other patient management decisions. Negative results must be combined with clinical observations, patient history, and epidemiological information. The expected result is Negative. Fact Sheet for Patients: SugarRoll.be Fact Sheet for Healthcare Providers: https://www.woods-mathews.com/ This test is not yet approved or cleared by the [place] FDA and  has been authorized for detection and/or diagnosis of SARS-CoV-2 by FDA under an Emergency Use Authorization (EUA). This EUA will remain  in effect (meaning this test can be used) for the duration of the COVID-19 declaration under Section 56 4(b)(1) of the Act, 21 U.S.C. section 360bbb-3(b)(1), unless the authorization is terminated or revoked sooner. Performed at [facility], [place] [address], [place],  [place] [address]     Procedures/Studies: CT Head Wo Contrast  Result Date: 01/24/2020 CLINICAL DATA:  Ataxia.  Hallucinations. EXAM: CT HEAD WITHOUT CONTRAST TECHNIQUE: Contiguous axial images were obtained from the base of the skull through the vertex without intravenous contrast. COMPARISON:  Mar 08, 2017. FINDINGS: Brain: No evidence of acute infarction, hemorrhage, hydrocephalus, extra-axial collection or mass lesion/mass effect. Advanced atrophy and chronic microvascular ischemic changes are noted. There are left-sided basal ganglia lacunar infarcts. Vascular: No hyperdense vessel or unexpected calcification. Skull: Normal. Negative for fracture or focal lesion. Sinuses/Orbits: There mucosal retention cysts involving the bilateral maxillary sinuses. There is some mucosal thickening involving the bilateral ethmoid air cells. The remaining paranasal sinuses and mastoid air cells are essentially clear. Other: None. IMPRESSION:  No acute intracranial abnormality. Atrophy and advanced chronic microvascular ischemic changes are noted. Electronically Signed   By: Constance Holster M.D.   On: 01/24/2020 16:58   DG Chest Port 1 View  Result Date: 01/24/2020 CLINICAL DATA:  Weakness. EXAM: PORTABLE CHEST 1 VIEW COMPARISON:  January 16, 2010 FINDINGS: Heart size is significantly enlarged. Aortic calcifications are noted. There is mild vascular congestion without overt pulmonary edema. The lungs appear to be slightly hyperexpanded. There is some blunting of the costophrenic angles bilaterally. The right paratracheal stripe appears thickened which is likely secondary to vascular structures in this region as seen on the patient's prior CT neck. IMPRESSION: Cardiomegaly with mild vascular congestion. Electronically Signed   By: Constance Holster M.D.   On: 01/24/2020 15:47   ECHOCARDIOGRAM COMPLETE  Result Date: 01/25/2020    ECHOCARDIOGRAM REPORT   Patient Name:   Patrick Potts Date of Exam: 01/25/2020 Medical Rec #:   [id number]       Height:       69.0 in Accession #:    [id number]      Weight:       157.5 lb Date of Birth:  17-Jun-1925      BSA:          1.867 m Patient Age:    19 years        BP:           114/77 mmHg Patient Gender: M               HR:           69 bpm. Exam Location:  Inpatient Procedure: 2D Echo, Cardiac Doppler and Color Doppler Indications:    R07.89 Other chest pain  History:        Patient has no prior history of Echocardiogram examinations. 86                 year-old with sudden onset altered mental status.  Sonographer:    Merrie Roof RDCS Referring Phys: [place]  1. Left ventricular ejection fraction, by estimation, is 20 to 25%. The left ventricle has severely decreased function. The left ventricle demonstrates regional wall motion abnormalities (see scoring diagram/findings for description). The left ventricular internal cavity size was mildly to moderately dilated. Left ventricular diastolic parameters are indeterminate.  2. Right ventricular systolic function is moderately reduced. The right ventricular size is mildly enlarged. There is moderately elevated pulmonary artery systolic pressure. The estimated right ventricular systolic pressure is 75.1 mmHg.  3. Left atrial size was severely dilated.  4. Right atrial size was severely dilated.  5. The mitral valve is abnormal. Moderate mitral valve regurgitation. No evidence of mitral stenosis.  6. Tricuspid valve regurgitation is moderate.  7. The aortic valve is abnormal - severely calcified with severely restricted leaflet excursion. Aortic valve regurgitation is trivial. At least moderate-severe aortic valve stenosis, low flow low gradient. Dimensionless index is 0.19. Aortic valve mean  gradient measures 8.0 mmHg in the setting of severely reduced cardiac output.  8. The inferior vena cava is dilated in size with <50% respiratory variability, suggesting right atrial pressure of 15 mmHg. Comparison(s): No prior  Echocardiogram. FINDINGS  Left Ventricle: Left ventricular ejection fraction, by estimation, is 20 to 25%. The left ventricle has severely decreased function. The left ventricle demonstrates regional wall motion abnormalities. Definity contrast agent was given IV to delineate the left ventricular endocardial borders. The left ventricular internal cavity size was mildly  to moderately dilated. There is no left ventricular hypertrophy. Left ventricular diastolic parameters are indeterminate.  LV Wall Scoring: Severe global hypokinesis with akinesis of the septum. Right Ventricle: The right ventricular size is mildly enlarged. No increase in right ventricular wall thickness. Right ventricular systolic function is moderately reduced. There is moderately elevated pulmonary artery systolic pressure. The tricuspid regurgitant velocity is 2.90 m/s, and with an assumed right atrial pressure of 15 mmHg, the estimated right ventricular systolic pressure is 47.8 mmHg. Left Atrium: Left atrial size was severely dilated. Right Atrium: Right atrial size was severely dilated. Pericardium: Trivial pericardial effusion is present. The pericardial effusion is posterior to the left ventricle. Mitral Valve: The mitral valve is abnormal. There is mild thickening of the mitral valve leaflet(s). Normal mobility of the mitral valve leaflets. Moderate mitral valve regurgitation. No evidence of mitral valve stenosis. Tricuspid Valve: The tricuspid valve is normal in structure. Tricuspid valve regurgitation is moderate . No evidence of tricuspid stenosis. Aortic Valve: The aortic valve is abnormal. Aortic valve regurgitation is trivial. Severe aortic stenosis is present. There is severe calcifcation of the aortic valve. Aortic valve mean gradient measures 8.0 mmHg. Aortic valve peak gradient measures 9.7 mmHg. Aortic valve area, by VTI measures 0.73 cm. Pulmonic Valve: The pulmonic valve was normal in structure. Pulmonic valve regurgitation is  mild. No evidence of pulmonic stenosis. Aorta: The aortic root is normal in size and structure. Venous: The inferior vena cava is dilated in size with less than 50% respiratory variability, suggesting right atrial pressure of 15 mmHg. IAS/Shunts: No atrial level shunt detected by color flow Doppler. EKG: Rhythm strip during this exam demostrated atrial fibrillation.  LEFT VENTRICLE PLAX 2D LVIDd:         6.00 cm LVIDs:         5.20 cm LV PW:         1.10 cm LV IVS:        1.00 cm LVOT diam:     2.20 cm LV SV:         28 LV SV Index:   15 LVOT Area:     3.80 cm  LV Volumes (MOD) LV vol d, MOD A2C: 145.0 ml LV vol d, MOD A4C: 103.0 ml LV vol s, MOD A2C: 95.7 ml LV vol s, MOD A4C: 69.4 ml LV SV MOD A2C:     49.3 ml LV SV MOD A4C:     103.0 ml LV SV MOD BP:      38.4 ml RIGHT VENTRICLE          IVC RV Basal diam:  4.90 cm  IVC diam: 2.80 cm RV Mid diam:    3.90 cm TAPSE (M-mode): 1.0 cm LEFT ATRIUM              Index       RIGHT ATRIUM           Index LA diam:        4.20 cm  2.25 cm/m  RA Area:     25.60 cm LA Vol (A2C):   104.0 ml 55.72 ml/m RA Volume:   88.60 ml  47.47 ml/m LA Vol (A4C):   104.0 ml 55.72 ml/m LA Biplane Vol: 107.0 ml 57.33 ml/m  AORTIC VALVE AV Area (Vmax):    1.15 cm AV Area (Vmean):   1.07 cm AV Area (VTI):     0.73 cm AV Vmax:           156.00 cm/s  AV Vmean:          104.000 cm/s AV VTI:            0.379 m AV Peak Grad:      9.7 mmHg AV Mean Grad:      8.0 mmHg LVOT Vmax:         47.10 cm/s LVOT Vmean:        29.400 cm/s LVOT VTI:          0.072 m LVOT/AV VTI ratio: 0.19  AORTA Ao Root diam: 3.70 cm Ao Asc diam:  3.70 cm MR Peak grad:    84.3 mmHg   TRICUSPID VALVE MR Mean grad:    48.0 mmHg   TR Peak grad:   33.6 mmHg MR Vmax:         459.00 cm/s TR Vmax:        290.00 cm/s MR Vmean:        320.0 cm/s MR PISA:         1.57 cm    SHUNTS MR PISA Eff ROA: 11 mm      Systemic VTI:  0.07 m MR PISA Radius:  0.50 cm     Systemic Diam: 2.20 cm Cherlynn Kaiser MD Electronically signed by  Cherlynn Kaiser MD Signature Date/Time: 01/25/2020/9:55:29 PM    Final      Time coordinating discharge: Over 30 minutes  SIGNED:   Guilford Shi, MD  [facility] 01/30/2020, 10:38 AM

## 2020-01-30 NOTE — TOC Transition Note (Signed)
Transition of Care [facility]) - CM/SW Discharge Note   Patient Details  Name: Patrick Potts MRN: [id number] Date of Birth: 1925/07/28  Transition of [facility] CM/SW Contact:  Gala Lewandowsky, RN Phone Number: 01/30/2020, 10:13 AM   Clinical Narrative: Case Manager received consult for [facility]- Case Manager did reach out to wife to confirm plan of care. Wife is agreeable to go forward with H[facility]. Wife is aware that patient will travel via [place]. Case Manager did call Liaison Cassandra- bed is available. Wife to sign paperwork this am.  RN to call report to [phone]. Patient will travel via [place]. COVID is negative from 01-24-20. MD is aware of plan of care and physician is working on discharge for transition to [facility]. [facility] for transport scheduled for 1200 pm. Case Manager will call wife to make her aware and sons are at the bedside. No further needs from Case Manager at this time.        Final next level of care: Hospice Medical Facility Barriers to Discharge: No Barriers Identified   Patient Goals and CMS Choice Patient states their goals for this hospitalization and ongoing recovery are:: "to return home" CMS Medicare.gov Compare Post Acute Care list provided to:: Patient Represenative (must comment)(Wife Joann) Choice offered to / list presented to : Spouse   Discharge Plan and Services In-house Referral: NA Discharge Planning Services: CM Consult Post Acute Care Choice: Hospice(Residential Facility)               HH Arranged: RN [facility] Agency: [facility] Date [facility] Agency Contacted: 01/28/20 Time HH Agency Contacted: 1525 Representative spoke with at [facility] Agency: Cassandra- voicemail left   Readmission Risk Interventions No flowsheet data found.

## 2020-02-05 DEATH — deceased

## 2020-02-11 ENCOUNTER — Ambulatory Visit: Payer: Medicare Other | Admitting: Cardiology

## 2021-07-24 IMAGING — DX DG CHEST 1V PORT
1 series · 1 of 1 positions shown · non-contrast
Comparison: January 16, 2010

CLINICAL DATA: Weakness.

EXAM:
PORTABLE CHEST 1 VIEW

[chest ap]
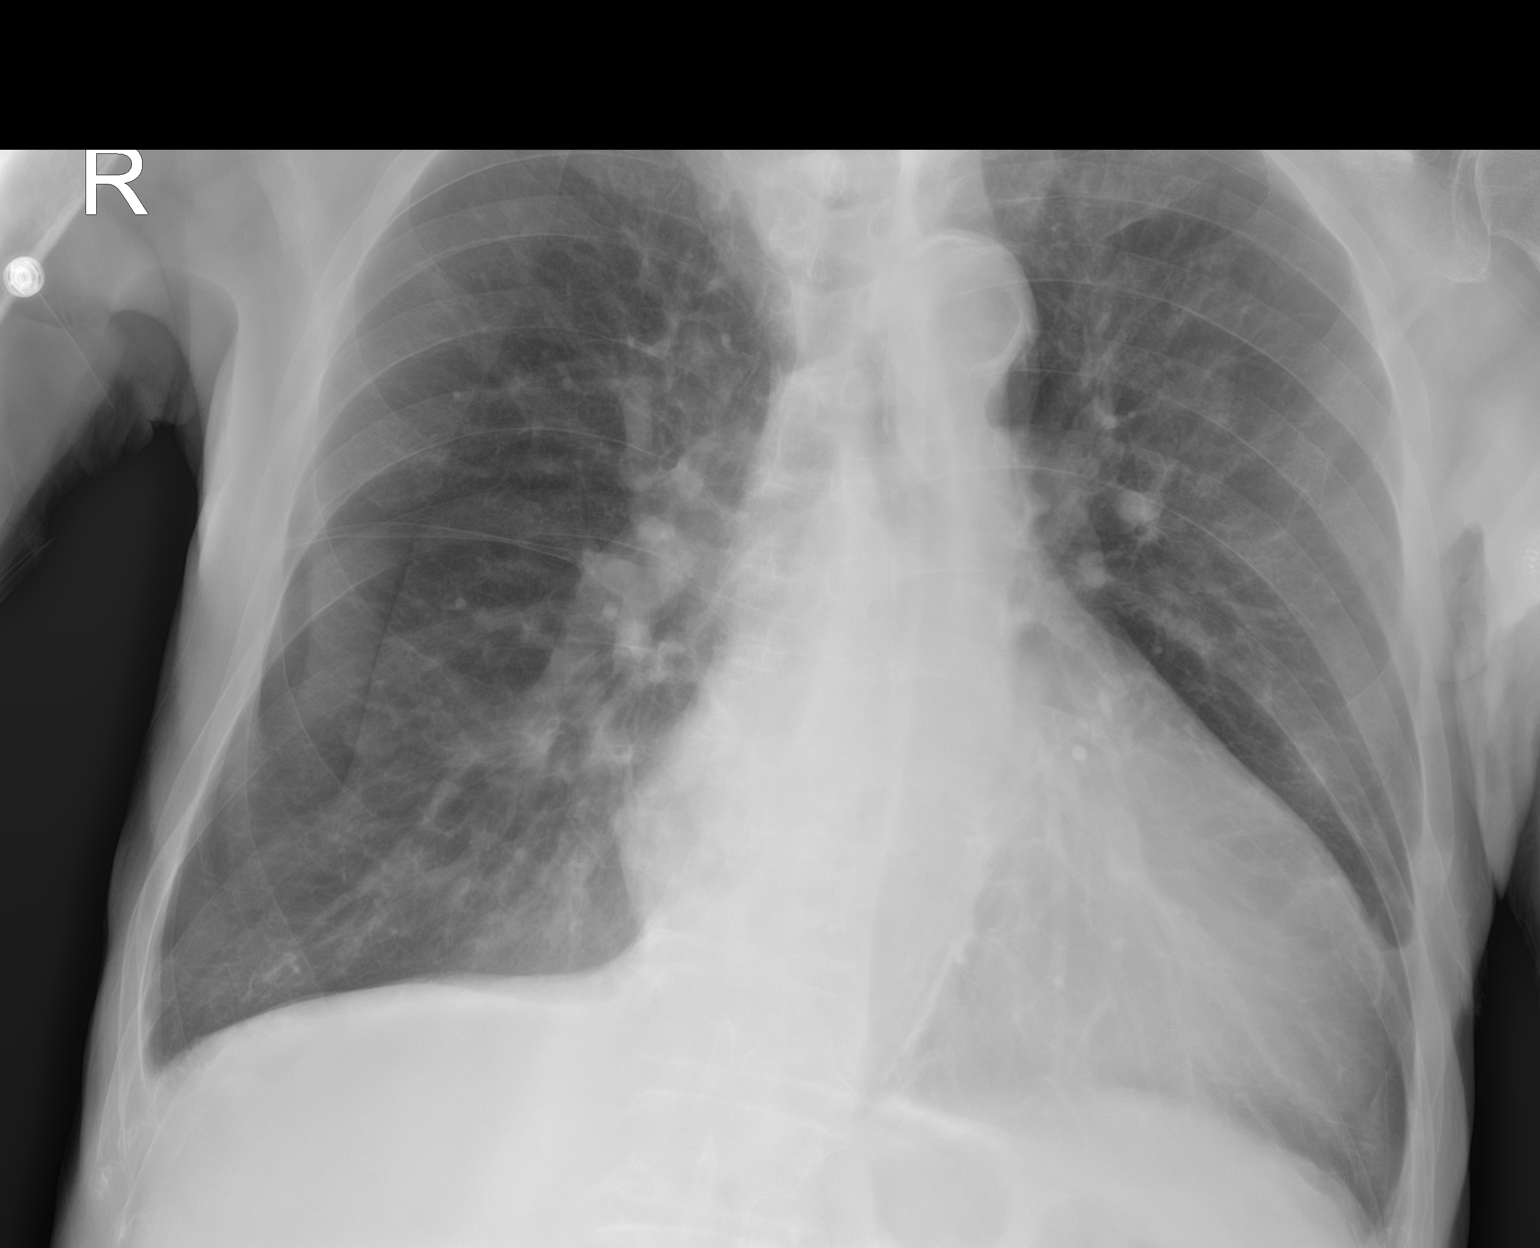

[1 of 1 positions shown; findings below may reference images not displayed]

FINDINGS: Heart size is significantly enlarged. Aortic calcifications are
noted. There is mild vascular congestion without overt pulmonary
edema. The lungs appear to be slightly hyperexpanded. There is some
blunting of the costophrenic angles bilaterally. The right
paratracheal stripe appears thickened which is likely secondary to
vascular structures in this region as seen on the patient's prior CT
neck.
IMPRESSION: Cardiomegaly with mild vascular congestion.
